# Patient Record
Sex: Female | Born: 1990 | Race: Black or African American | Hispanic: No | Marital: Single | Smoking: Never smoker
Health system: Southern US, Community
[De-identification: ages and names within clinical notes are randomized; demographics above are authoritative.]

## PROBLEM LIST (undated history)

## (undated) DIAGNOSIS — F32A Depression, unspecified: Secondary | ICD-10-CM

## (undated) DIAGNOSIS — F329 Major depressive disorder, single episode, unspecified: Secondary | ICD-10-CM

## (undated) DIAGNOSIS — A150 Tuberculosis of lung: Secondary | ICD-10-CM

## (undated) DIAGNOSIS — Z789 Other specified health status: Secondary | ICD-10-CM

## (undated) HISTORY — PX: NO PAST SURGERIES: SHX2092

---

## 2014-06-11 NOTE — L&D Delivery Note (Signed)
Patient is 24 y.o. G1P0 [redacted]w[redacted]d admitted for IOL for PROM.  Delivery Note At 1:02 PM a viable female was delivered via Vaginal, Spontaneous Delivery (Presentation: Left Occiput Anterior).  APGAR: 8, 9; weight pending.   Placenta status: Intact, Spontaneous. Cord: 3 vessels with the following complications: None.    Anesthesia: Epidural  Episiotomy: None Lacerations: 2nd degree Suture Repair: 3.0 vicryl Est. Blood Loss (mL):  75  Mom to postpartum.  Baby to Couplet care / Skin to Skin.  Tarri Abernethy 02/24/2015, 1:41 PM  OB fellow attestation: Patient is a G1P1001 at [redacted]w[redacted]d who was admitted for SOL, uncomplicated prenatal course.  She progressed without augmentation.   I was gloved and present for delivery in its entirety.  Second stage of labor progressed, baby delivered after 4-5 contractions.  Complications: none  Lacerations: 2nd degree  EBL:75  Federico Flake, MD 3:25 PM

## 2014-06-28 ENCOUNTER — Emergency Department (HOSPITAL_COMMUNITY)
Admission: EM | Admit: 2014-06-28 | Discharge: 2014-06-28 | Disposition: A | Discharge status: Home / Self Care | Payer: Managed Care, Other (non HMO) | Attending: Emergency Medicine | Admitting: Emergency Medicine

## 2014-06-28 ENCOUNTER — Emergency Department (HOSPITAL_COMMUNITY): Payer: Managed Care, Other (non HMO)

## 2014-06-28 ENCOUNTER — Encounter (HOSPITAL_COMMUNITY): Payer: Self-pay | Admitting: Family Medicine

## 2014-06-28 DIAGNOSIS — Z3A01 Less than 8 weeks gestation of pregnancy: Secondary | ICD-10-CM | POA: Insufficient documentation

## 2014-06-28 DIAGNOSIS — R1110 Vomiting, unspecified: Secondary | ICD-10-CM

## 2014-06-28 DIAGNOSIS — R1032 Left lower quadrant pain: Secondary | ICD-10-CM | POA: Insufficient documentation

## 2014-06-28 DIAGNOSIS — O210 Mild hyperemesis gravidarum: Secondary | ICD-10-CM | POA: Insufficient documentation

## 2014-06-28 DIAGNOSIS — Z3490 Encounter for supervision of normal pregnancy, unspecified, unspecified trimester: Secondary | ICD-10-CM

## 2014-06-28 DIAGNOSIS — O9989 Other specified diseases and conditions complicating pregnancy, childbirth and the puerperium: Secondary | ICD-10-CM | POA: Diagnosis present

## 2014-06-28 DIAGNOSIS — R1031 Right lower quadrant pain: Secondary | ICD-10-CM | POA: Diagnosis not present

## 2014-06-28 LAB — WET PREP, GENITAL
Trich, Wet Prep: NONE SEEN
WBC, Wet Prep HPF POC: NONE SEEN
Yeast Wet Prep HPF POC: NONE SEEN

## 2014-06-28 LAB — URINALYSIS, ROUTINE W REFLEX MICROSCOPIC
BILIRUBIN URINE: NEGATIVE
GLUCOSE, UA: NEGATIVE mg/dL
Hgb urine dipstick: NEGATIVE
Leukocytes, UA: NEGATIVE
Nitrite: NEGATIVE
Protein, ur: NEGATIVE mg/dL
Specific Gravity, Urine: 1.031 — ABNORMAL HIGH (ref 1.005–1.030)
Urobilinogen, UA: 1 mg/dL (ref 0.0–1.0)
pH: 6 (ref 5.0–8.0)

## 2014-06-28 LAB — CBC WITH DIFFERENTIAL/PLATELET
BASOS ABS: 0 10*3/uL (ref 0.0–0.1)
Basophils Relative: 0 % (ref 0–1)
Eosinophils Absolute: 0 10*3/uL (ref 0.0–0.7)
Eosinophils Relative: 0 % (ref 0–5)
HCT: 38.2 % (ref 36.0–46.0)
Hemoglobin: 12.9 g/dL (ref 12.0–15.0)
Lymphocytes Relative: 14 % (ref 12–46)
Lymphs Abs: 1.2 10*3/uL (ref 0.7–4.0)
MCH: 28.1 pg (ref 26.0–34.0)
MCHC: 33.8 g/dL (ref 30.0–36.0)
MCV: 83.2 fL (ref 78.0–100.0)
MONO ABS: 0.6 10*3/uL (ref 0.1–1.0)
MONOS PCT: 7 % (ref 3–12)
NEUTROS ABS: 6.5 10*3/uL (ref 1.7–7.7)
NEUTROS PCT: 79 % — AB (ref 43–77)
Platelets: 274 10*3/uL (ref 150–400)
RBC: 4.59 MIL/uL (ref 3.87–5.11)
RDW: 13.3 % (ref 11.5–15.5)
WBC: 8.3 10*3/uL (ref 4.0–10.5)

## 2014-06-28 LAB — COMPREHENSIVE METABOLIC PANEL
ALBUMIN: 4.6 g/dL (ref 3.5–5.2)
ALT: 12 U/L (ref 0–35)
AST: 23 U/L (ref 0–37)
Alkaline Phosphatase: 55 U/L (ref 39–117)
Anion gap: 14 (ref 5–15)
BUN: 7 mg/dL (ref 6–23)
CO2: 19 mmol/L (ref 19–32)
CREATININE: 0.75 mg/dL (ref 0.50–1.10)
Calcium: 9.5 mg/dL (ref 8.4–10.5)
Chloride: 104 mEq/L (ref 96–112)
GFR calc Af Amer: >90 mL/min (ref >90)
GFR calc non Af Amer: >90 mL/min (ref >90)
GLUCOSE: 86 mg/dL (ref 70–99)
Potassium: 3.8 mmol/L (ref 3.5–5.1)
Sodium: 137 mmol/L (ref 135–145)
Total Bilirubin: 1.4 mg/dL — ABNORMAL HIGH (ref 0.3–1.2)
Total Protein: 7.6 g/dL (ref 6.0–8.3)

## 2014-06-28 LAB — HCG, QUANTITATIVE, PREGNANCY: HCG, BETA CHAIN, QUANT, S: 47080 m[IU]/mL — AB (ref <5)

## 2014-06-28 LAB — PREGNANCY, URINE: Preg Test, Ur: POSITIVE — AB

## 2014-06-28 LAB — I-STAT BETA HCG BLOOD, ED (MC, WL, AP ONLY): I-stat hCG, quantitative: >2000 m[IU]/mL — ABNORMAL HIGH (ref <5)

## 2014-06-28 LAB — LIPASE, BLOOD: LIPASE: 25 U/L (ref 11–59)

## 2014-06-28 LAB — HCG, SERUM, QUALITATIVE: Preg, Serum: POSITIVE — AB

## 2014-06-28 MED ORDER — SODIUM CHLORIDE 0.9 % IV BOLUS (SEPSIS)
1000.0000 mL | Freq: Once | INTRAVENOUS | Status: AC
  Administered 2014-06-28: 1000 mL via INTRAVENOUS

## 2014-06-28 MED ORDER — PRENATAL COMPLETE 14-0.4 MG PO TABS: 1.0000 | ORAL_TABLET | Freq: Every day | ORAL | Status: DC

## 2014-06-28 NOTE — ED Notes (Signed)
PA at bedside.

## 2014-06-28 NOTE — Discharge Instructions (Signed)
Please call your doctor for a followup appointment within 24-48 hours. When you talk to your doctor please let them know that you were seen in the emergency department and have them acquire all of your records so that they can discuss the findings with you and formulate a treatment plan to fully care for your new and ongoing problems. Please follow-up with OB/GYN for further following regarding pregnancy Please rest and stay hydrated Please take to drink plenty of water Please take prenatal vitamins as prescribed Please continue to monitor symptoms closely and if symptoms are to worsen or change (fever greater than 101, chills, sweating, nausea, vomiting, chest pain, shortness of breathe, difficulty breathing, weakness, numbness, tingling, worsening or changes to pain pattern, abdominal cramping, vaginal bleeding, vaginal discharge, inability keep food or fluids down) please report back to the Emergency Department immediately.   First Trimester of Pregnancy The first trimester of pregnancy is from week 1 until the end of week 12 (months 1 through 3). A week after a sperm fertilizes an egg, the egg will implant on the wall of the uterus. This embryo will begin to develop into a baby. Genes from you and your partner are forming the baby. The female genes determine whether the baby is a boy or a girl. At 6-8 weeks, the eyes and face are formed, and the heartbeat can be seen on ultrasound. At the end of 12 weeks, all the baby's organs are formed.  Now that you are pregnant, you will want to do everything you can to have a healthy baby. Two of the most important things are to get good prenatal care and to follow your health care provider's instructions. Prenatal care is all the medical care you receive before the baby's birth. This care will help prevent, find, and treat any problems during the pregnancy and childbirth. BODY CHANGES Your body goes through many changes during pregnancy. The changes vary from  woman to woman.   You may gain or lose a couple of pounds at first.  You may feel sick to your stomach (nauseous) and throw up (vomit). If the vomiting is uncontrollable, call your health care provider.  You may tire easily.  You may develop headaches that can be relieved by medicines approved by your health care provider.  You may urinate more often. Painful urination may mean you have a bladder infection.  You may develop heartburn as a result of your pregnancy.  You may develop constipation because certain hormones are causing the muscles that push waste through your intestines to slow down.  You may develop hemorrhoids or swollen, bulging veins (varicose veins).  Your breasts may begin to grow larger and become tender. Your nipples may stick out more, and the tissue that surrounds them (areola) may become darker.  Your gums may bleed and may be sensitive to brushing and flossing.  Dark spots or blotches (chloasma, mask of pregnancy) may develop on your face. This will likely fade after the baby is born.  Your menstrual periods will stop.  You may have a loss of appetite.  You may develop cravings for certain kinds of food.  You may have changes in your emotions from day to day, such as being excited to be pregnant or being concerned that something may go wrong with the pregnancy and baby.  You may have more vivid and strange dreams.  You may have changes in your hair. These can include thickening of your hair, rapid growth, and changes in texture. Some  women also have hair loss during or after pregnancy, or hair that feels dry or thin. Your hair will most likely return to normal after your baby is born. WHAT TO EXPECT AT YOUR PRENATAL VISITS During a routine prenatal visit:  You will be weighed to make sure you and the baby are growing normally.  Your blood pressure will be taken.  Your abdomen will be measured to track your baby's growth.  The fetal heartbeat will be  listened to starting around week 10 or 12 of your pregnancy.  Test results from any previous visits will be discussed. Your health care provider may ask you:  How you are feeling.  If you are feeling the baby move.  If you have had any abnormal symptoms, such as leaking fluid, bleeding, severe headaches, or abdominal cramping.  If you have any questions. Other tests that may be performed during your first trimester include:  Blood tests to find your blood type and to check for the presence of any previous infections. They will also be used to check for low iron levels (anemia) and Rh antibodies. Later in the pregnancy, blood tests for diabetes will be done along with other tests if problems develop.  Urine tests to check for infections, diabetes, or protein in the urine.  An ultrasound to confirm the proper growth and development of the baby.  An amniocentesis to check for possible genetic problems.  Fetal screens for spina bifida and Down syndrome.  You may need other tests to make sure you and the baby are doing well. HOME CARE INSTRUCTIONS  Medicines  Follow your health care provider's instructions regarding medicine use. Specific medicines may be either safe or unsafe to take during pregnancy.  Take your prenatal vitamins as directed.  If you develop constipation, try taking a stool softener if your health care provider approves. Diet  Eat regular, well-balanced meals. Choose a variety of foods, such as meat or vegetable-based protein, fish, milk and low-fat dairy products, vegetables, fruits, and whole grain breads and cereals. Your health care provider will help you determine the amount of weight gain that is right for you.  Avoid raw meat and uncooked cheese. These carry germs that can cause birth defects in the baby.  Eating four or five small meals rather than three large meals a day may help relieve nausea and vomiting. If you start to feel nauseous, eating a few soda  crackers can be helpful. Drinking liquids between meals instead of during meals also seems to help nausea and vomiting.  If you develop constipation, eat more high-fiber foods, such as fresh vegetables or fruit and whole grains. Drink enough fluids to keep your urine clear or pale yellow. Activity and Exercise  Exercise only as directed by your health care provider. Exercising will help you:  Control your weight.  Stay in shape.  Be prepared for labor and delivery.  Experiencing pain or cramping in the lower abdomen or low back is a good sign that you should stop exercising. Check with your health care provider before continuing normal exercises.  Try to avoid standing for long periods of time. Move your legs often if you must stand in one place for a long time.  Avoid heavy lifting.  Wear low-heeled shoes, and practice good posture.  You may continue to have sex unless your health care provider directs you otherwise. Relief of Pain or Discomfort  Wear a good support bra for breast tenderness.   Take warm sitz baths to  soothe any pain or discomfort caused by hemorrhoids. Use hemorrhoid cream if your health care provider approves.   Rest with your legs elevated if you have leg cramps or low back pain.  If you develop varicose veins in your legs, wear support hose. Elevate your feet for 15 minutes, 3-4 times a day. Limit salt in your diet. Prenatal Care  Schedule your prenatal visits by the twelfth week of pregnancy. They are usually scheduled monthly at first, then more often in the last 2 months before delivery.  Write down your questions. Take them to your prenatal visits.  Keep all your prenatal visits as directed by your health care provider. Safety  Wear your seat belt at all times when driving.  Make a list of emergency phone numbers, including numbers for family, friends, the hospital, and police and fire departments. General Tips  Ask your health care provider  for a referral to a local prenatal education class. Begin classes no later than at the beginning of month 6 of your pregnancy.  Ask for help if you have counseling or nutritional needs during pregnancy. Your health care provider can offer advice or refer you to specialists for help with various needs.  Do not use hot tubs, steam rooms, or saunas.  Do not douche or use tampons or scented sanitary pads.  Do not cross your legs for long periods of time.  Avoid cat litter boxes and soil used by cats. These carry germs that can cause birth defects in the baby and possibly loss of the fetus by miscarriage or stillbirth.  Avoid all smoking, herbs, alcohol, and medicines not prescribed by your health care provider. Chemicals in these affect the formation and growth of the baby.  Schedule a dentist appointment. At home, brush your teeth with a soft toothbrush and be gentle when you floss. SEEK MEDICAL CARE IF:   You have dizziness.  You have mild pelvic cramps, pelvic pressure, or nagging pain in the abdominal area.  You have persistent nausea, vomiting, or diarrhea.  You have a bad smelling vaginal discharge.  You have pain with urination.  You notice increased swelling in your face, hands, legs, or ankles. SEEK IMMEDIATE MEDICAL CARE IF:   You have a fever.  You are leaking fluid from your vagina.  You have spotting or bleeding from your vagina.  You have severe abdominal cramping or pain.  You have rapid weight gain or loss.  You vomit blood or material that looks like coffee grounds.  You are exposed to [place] measles and have never had them.  You are exposed to fifth disease or chickenpox.  You develop a severe headache.  You have shortness of breath.  You have any kind of trauma, such as from a fall or a car accident. Document Released: 05/22/2001 Document Revised: 10/12/2013 Document Reviewed: 04/07/2013 [facility] Patient Information 2015 [place], [place]. This  information is not intended to replace advice given to you by your health care provider. Make sure you discuss any questions you have with your health care provider.   Emergency Department Resource Guide 1) Find a Doctor and Pay Out of Pocket Although you won't have to find out who is covered by your insurance plan, it is a good idea to ask around and get recommendations. You will then need to call the office and see if the doctor you have chosen will accept you as a new patient and what types of options they offer for patients who are self-pay. Some doctors  offer discounts or will set up payment plans for their patients who do not have insurance, but you will need to ask so you aren't surprised when you get to your appointment.  2) Contact Your Local Health Department Not all health departments have doctors that can see patients for sick visits, but many do, so it is worth a call to see if yours does. If you don't know where your local health department is, you can check in your phone book. The CDC also has a tool to help you locate your state's health department, and many state websites also have listings of all of their local health departments.  3) Find a Walk-in Clinic If your illness is not likely to be very severe or complicated, you may want to try a walk in clinic. These are popping up all over the country in pharmacies, drugstores, and shopping centers. They're usually staffed by nurse practitioners or physician assistants that have been trained to treat common illnesses and complaints. They're usually fairly quick and inexpensive. However, if you have serious medical issues or chronic medical problems, these are probably not your best option.  No Primary Care Doctor: - Call Health Connect at  [phone] - they can help you locate a primary care doctor that  accepts your insurance, provides certain services, etc. - Physician Referral Service- [phone]  Chronic Pain  Problems: Organization         Address  Phone   Notes  [facility]  [phone]) [phone] Patients need to be referred by their primary care doctor.   Medication Assistance: Organization         Address  Phone   Notes  [facility] Medication [facility] [address] [place], [place] [address] [phone] --Must be a resident of [place] -- Must have NO insurance coverage whatsoever (no Medicaid/ Medicare, etc.) -- The pt. MUST have a primary care doctor that directs their care regularly and follows them in the community   Med[facility]ist  [phone]   [employer]  [phone]) [phone]    Agencies that provide inexpensive medical care: Organization         Address  Phone   Notes  [facility]  [phone]   [facility]    [phone]   [facility] [address] [place], [place] [address] [phone]) [phone]   [facility] [address], [place] [phone]   [facility]    [phone]) [phone]   [facility]    [phone](336) [phone]   [facility]  [address], [place] Phone:  [phone], Fax:  [phone] Hours of Operation:  9 am - 6 pm, M-F.  Also accepts Medicaid/Medicare and self-pay.  [facility]  [address], [place] Phone: [phone], Fax: [phone]. Hours of Operation:  8:30 am - 5:30 pm, M-F.  Also accepts Medicaid and self-pay.  [facility] [place] [address], [place] Phone: [phone]) [phone]   [facility] [address], [place], [place] [phone], Ext. [phone] Mondays & Thursdays: 7-9 AM.  First 15 patients are seen on a first come, first serve basis.    Medicaid-accepting [facility] Providers:  Organization         Address  Phone   Notes  [facility] [address],  [place] [phone] Also  accepts self-pay patients.  [facility] [address] Laurell Josephs [place], [place]  [phone]   [facility] [address], [place] [phone]   [facility] [address], [place] [phone]   Renaye Rakers [address], [place]   [phone] Only accepts [place] Access [place] patients after they have their name applied to their card.   Self-Pay (no insurance) in [facility]:  Organization         Address  Phone   Notes  Sickle Cell Patients, [facility] [address] [place], [place] [phone]   [facility] [address] [place], [place] [phone]   [facility] [place]  [address], [place] [phone]   [facility]/Dr. Osei-Bonsu  [address], [place] or [address], [place] [phone] Phone number for both [place] and [place] locations is the same.  [facility] and [facility] [address] [place] [phone]   [facility] [address], [place] or [address] [phone]   [facility] [address] [place] [phone], phone; [phone], fax Sees patients 1st and 3rd Saturday of every month.  Must not qualify for public or private insurance (i.e. Medicaid, Medicare, [place] Health Choice, Veterans' Benefits)  Household income should be no more than 200% of the poverty level The clinic cannot treat you if you are pregnant or think you are pregnant  Sexually transmitted diseases are not treated at the clinic.    Dental Care: Organization         Address  Phone  Notes  [facility] [address] [place], [place] [phone] Accepts children up to age 57 who are enrolled in [place] or [place] Health Choice; pregnant  women with a Medicaid card; and children who have applied for Medicaid or [place] Health Choice, but were declined, whose parents can pay a reduced fee at time of service.  [facility] Department of [facility]  [address], [place] [phone] Accepts children up to age 34 who are enrolled in [place] or [place] Health Choice; pregnant women with a Medicaid card; and children who have applied for Medicaid or [place] Health Choice, but were declined, whose parents can pay a reduced fee at time of service.  [facility]  [address] [place], [place] [phone] Patients are seen by appointment only. Walk-ins are not accepted. [facility] will see patients 4 years of age and older. Monday - Tuesday (8am-5pm) Most Wednesdays (8:30-5pm) $30 per visit, cash only  [facility]  [address], [facility] [phone] Patients are seen by appointment only. Walk-ins are not accepted. [facility] will see patients 91 years of age and older. One Wednesday Evening (Monthly: Volunteer Based).  $30 per visit, cash only  [employer]  [phone] for adults; Children under age 85, call Graduate Pediatric Dentistry at [phone]. Children aged 43-14, please call [phone] to request a pediatric application.  Dental services are provided in all areas of dental care including fillings, crowns and bridges, complete and partial dentures, implants, gum treatment, root canals, and extractions. Preventive care is  also provided. Treatment is provided to both adults and children. Patients are selected via a lottery and there is often a waiting list.   [facility] [address], [place]  [phone] [email or web address]   [facility] [address] [place], [place] [phone], Ext. [phone] Second and Fourth Thursday of each month, opens at 6:30 AM; Clinic ends at 9 AM.  Patients are  seen on a first-come first-served basis, and a limited number are seen during each clinic.   [facility]  [address] Ether Griffins [place], [place] [phone]   Eligibility Requirements You must have lived in [place], [place], or [place] counties for at least the last three months.   You cannot be eligible for state or federal sponsored [place], including [employer], [place], or [employer].   You generally cannot be eligible for healthcare insurance through your employer.    How to apply: Eligibility screenings are held every Tuesday and Wednesday afternoon from 1:00 pm until 4:00 pm. You do not need an appointment for the interview!  [facility] [address], [place], [place] [phone]   [facility] Health Department  [phone]   [facility] Health Department  [phone]   [facility] Health Department  [phone]    Behavioral Health Resources in the Community: Intensive Outpatient Programs Organization         Address  Phone  Notes  [facility] [address], [place], [place] [phone]   [facility] Outpatient [address], [place], [place] [phone]   [facility] [address], [place], [place]  [phone]   [facility] Mental Health [address]  [place], [place] [phone] or [phone]   Substance Abuse Resources Organization         Address  Phone  Notes  [facility] Services  [phone]   [facility]  [phone]   [facility] [place]  [phone]   Floydene Flock  [phone]   Residential & Outpatient Substance Abuse Program  [phone]   Psychological Services Organization         Address  Phone  Notes  [facility] Behavioral Health  [phone]   [facility] Services  [phone]   [facility] Mental Health [address], [place] [phone] or [phone]    Mobile Crisis  Teams Organization         Address  Phone  Notes  [facility], Mobile Crisis Care Unit  [phone]   [facility]  [address] [place], [place] [phone]   Doristine Locks [address] [place] [phone]    Self-Help/Support Groups Organization         Address  Phone             Notes  [facility] [place] - variety of support groups  [phone]- I7437963 Call for more information  Narcotics Anonymous (NA), [facility] [address], [employer] [place]  2 meetings at this location   Statistician         Address  Phone  Notes  [facility] [address],    [place]  [phone]   [facility]  [address] [place] [address], [place], [place] [phone]   [facility] [address] [place], [place] [phone] Admissions: 8am-3pm M-F  [facility] [address]  [place], [place] [phone]   [facility] [address] Starling Manns [place], [place] [phone]   [facility] [address],  [place], [place] [phone]   [facility] - Intensive Outpatient [address], [place], [place] [phone]   [facility] (Addiction Recovery Care [facility] [address] [place].,  [place], [place] [phone] or [phone]   [facility] ([facility]) [address], [place], [place] [phone] Accepts Medicaid  [facility] [address]  [place] [phone] Substance Abuse/Addiction Treatment   [facility] Organization         Address  Phone  Notes  [facility]  [phone]   Angie Fava, PhD [address] Ervin Knack [place], [place]   [phone] or [phone]   [facility] Behavioral   [address] [place], [place] [phone]   [facility] [address], [place], [place] [phone]  Insurance/Medicaid/sponsorship through [facility] and Families [address]                                    [place], [place] [phone] Therapy/tele-psych/case  [facility] [address], [place] [phone]    Dr. Lolly Mustache  [phone]   [facility]  [facility] Dept. 1) [address], [place] 2) [address] [place] 3)  [address], [place] [phone]  [phone]   [facility] Child Abuse Hotline (416) 772-2030 or [phone] (After Hours)

## 2014-06-28 NOTE — ED Notes (Signed)
Pt a/o x 4 on d/c with steady gait. 

## 2014-06-28 NOTE — ED Notes (Signed)
Gave pt gingerale and crackers to see if she can tolerate.

## 2014-06-28 NOTE — ED Notes (Signed)
Pt here for abd pain, N,V and positive preg test. sts she started birth control at the beginning of the month and LMP December 7th.

## 2014-06-28 NOTE — ED Notes (Signed)
Pt able to tolerate fluids 

## 2014-06-28 NOTE — ED Provider Notes (Signed)
CSN: [id number]     Arrival date & time 06/28/14  1340 History   First MD Initiated Contact with Patient 06/28/14 1700     Chief Complaint  Patient presents with  . Abdominal Pain     (Consider location/radiation/quality/duration/timing/severity/associated sxs/prior Treatment) The history is provided by the patient. No language interpreter was used.  Deanna Warren is a 24 y/o F with no known significant PMHx presenting to the ED with abdominal pain, nausea, vomiting that started on Thursday. Patient reported that she has been having lower abdominal pain, mostly to the LLQ described as a constant aching sensation with intermittent sharp pain. Patient reported that she has been having episodes of emesis - stated that she has been able to keep fluids down, but denied food. Stated that she ate [employer] and [employer] on Thursday - denied any symptoms afterwards. Reported that her LMP was either the 05/17/2014 or 05/21/2014 - cannot remember. Stated that she got a DepoProvera shot early in January 2016 - cannot remember the date. Stated that she took a urine pregnancy test a couple of days ago and reported that it was positive. Denied diarrhea, fever, melena, hematochezia, chest pain, shortness of breath, difficulty breathing, hematuria, dysuria, vaginal bleeding, vaginal discharge, vaginal pain, abdominal cramping, dizziness, syncope, visual changes, back pain, neck pain, neck stiffness. PCP none  History reviewed. No pertinent past medical history. History reviewed. No pertinent past surgical history. History reviewed. No pertinent family history. History  Substance Use Topics  . Smoking status: Never Smoker   . Smokeless tobacco: Not on file  . Alcohol Use: Not on file   OB History    No data available     Review of Systems  Constitutional: Negative for fever and chills.  Respiratory: Negative for chest tightness and shortness of breath.   Cardiovascular: Negative for chest pain.   Gastrointestinal: Positive for nausea, vomiting and abdominal pain. Negative for diarrhea, constipation, blood in stool and anal bleeding.  Genitourinary: Negative for dysuria, decreased urine volume, vaginal bleeding, vaginal discharge, vaginal pain and pelvic pain.  Musculoskeletal: Negative for back pain, neck pain and neck stiffness.  Neurological: Negative for dizziness, weakness, numbness and headaches.      Allergies  Review of patient's allergies indicates no known allergies.  Home Medications   Prior to Admission medications   Medication Sig Start Date End Date Taking? Authorizing Provider  Prenatal Vit-Fe Fumarate-FA (PRENATAL COMPLETE) 14-0.4 MG TABS Take 1 tablet by mouth daily. 06/28/14   Paulett Kaufhold, PA-C   BP 106/45 mmHg  Pulse 79  Temp(Src) 97.8 F (36.6 C) (Oral)  Resp 16  SpO2 100%  LMP 05/17/2014 Physical Exam  Constitutional: She is oriented to person, place, and time. She appears well-developed and well-nourished. No distress.  HENT:  Head: Normocephalic and atraumatic.  Eyes: Conjunctivae and EOM are normal. Pupils are equal, round, and reactive to light. Right eye exhibits no discharge. Left eye exhibits no discharge.  Neck: Normal range of motion. Neck supple. No tracheal deviation present.  Cardiovascular: Normal rate, regular rhythm and normal heart sounds.  Exam reveals no friction rub.   No murmur heard. Pulmonary/Chest: Effort normal and breath sounds normal. No respiratory distress. She has no wheezes. She has no rales.  Abdominal: Soft. Bowel sounds are normal. She exhibits no distension. There is tenderness in the right lower quadrant, suprapubic area and left lower quadrant. There is no rebound and no guarding.  Negative abdominal distention Bowel sounds normal active in all 4 quadrants Abdomen  soft upon palpation Mild discomfort upon palpation to the lower quadrants of the abdomen-left lower quadrant, right lower quadrant, suprapubic-most  discomfort upon palpation to left lower quadrant Negative peritoneal signs Negative guarding or rigidity noted  Genitourinary:  Pelvic exam: Negative swelling, erythema, inflammation, lesions, sores, deformities, area of fluctuance or induration identified-negative findings of abscesses to the external region. Negative masses, polyps or lesions identified to the vaginal canal. Negative bright red blood in the vaginal vault. Thick white discharge in moderate amount identified. Cervix identified with negative friability. Negative CMT, adnexal tenderness bilaterally. Exam chaperoned with tech, June Leap.   Musculoskeletal: Normal range of motion.  Full ROM to upper and lower extremities without difficulty noted, negative ataxia noted.  Lymphadenopathy:    She has no cervical adenopathy.  Neurological: She is alert and oriented to person, place, and time. No cranial nerve deficit. She exhibits normal muscle tone. Coordination normal.  Skin: Skin is warm and dry. No rash noted. She is not diaphoretic. No erythema.  Psychiatric: She has a normal mood and affect. Her behavior is normal. Thought content normal.  Nursing note and vitals reviewed.   ED Course  Procedures (including critical care time)  Results for orders placed or performed during the hospital encounter of 06/28/14  Wet prep, genital  Result Value Ref Range   Yeast Wet Prep HPF POC NONE SEEN NONE SEEN   Trich, Wet Prep NONE SEEN NONE SEEN   Clue Cells Wet Prep HPF POC FEW (A) NONE SEEN   WBC, Wet Prep HPF POC NONE SEEN NONE SEEN  CBC with Differential  Result Value Ref Range   WBC 8.3 4.0 - 10.5 K/uL   RBC 4.59 3.87 - 5.11 MIL/uL   Hemoglobin 12.9 12.0 - 15.0 g/dL   HCT 76.1 60.7 - 37.1 %   MCV 83.2 78.0 - 100.0 fL   MCH 28.1 26.0 - 34.0 pg   MCHC 33.8 30.0 - 36.0 g/dL   RDW 06.2 69.4 - 85.4 %   Platelets 274 150 - 400 K/uL   Neutrophils Relative % 79 (H) 43 - 77 %   Neutro Abs 6.5 1.7 - 7.7 K/uL   Lymphocytes Relative 14  12 - 46 %   Lymphs Abs 1.2 0.7 - 4.0 K/uL   Monocytes Relative 7 3 - 12 %   Monocytes Absolute 0.6 0.1 - 1.0 K/uL   Eosinophils Relative 0 0 - 5 %   Eosinophils Absolute 0.0 0.0 - 0.7 K/uL   Basophils Relative 0 0 - 1 %   Basophils Absolute 0.0 0.0 - 0.1 K/uL  Comprehensive metabolic panel  Result Value Ref Range   Sodium 137 135 - 145 mmol/L   Potassium 3.8 3.5 - 5.1 mmol/L   Chloride 104 96 - 112 mEq/L   CO2 19 19 - 32 mmol/L   Glucose, Bld 86 70 - 99 mg/dL   BUN 7 6 - 23 mg/dL   Creatinine, Ser 6.27 0.50 - 1.10 mg/dL   Calcium 9.5 8.4 - 03.5 mg/dL   Total Protein 7.6 6.0 - 8.3 g/dL   Albumin 4.6 3.5 - 5.2 g/dL   AST 23 0 - 37 U/L   ALT 12 0 - 35 U/L   Alkaline Phosphatase 55 39 - 117 U/L   Total Bilirubin 1.4 (H) 0.3 - 1.2 mg/dL   GFR calc non Af Amer >90 >90 mL/min   GFR calc Af Amer >90 >90 mL/min   Anion gap 14 5 - 15  Pregnancy, urine  Result Value Ref Range   Preg Test, Ur POSITIVE (A) NEGATIVE  Urinalysis, Routine w reflex microscopic  Result Value Ref Range   Color, Urine YELLOW YELLOW   APPearance CLOUDY (A) CLEAR   Specific Gravity, Urine 1.031 (H) 1.005 - 1.030   pH 6.0 5.0 - 8.0   Glucose, UA NEGATIVE NEGATIVE mg/dL   Hgb urine dipstick NEGATIVE NEGATIVE   Bilirubin Urine NEGATIVE NEGATIVE   Ketones, ur >80 (A) NEGATIVE mg/dL   Protein, ur NEGATIVE NEGATIVE mg/dL   Urobilinogen, UA 1.0 0.0 - 1.0 mg/dL   Nitrite NEGATIVE NEGATIVE   Leukocytes, UA NEGATIVE NEGATIVE  Lipase, blood  Result Value Ref Range   Lipase 25 11 - 59 U/L  hCG, serum, qualitative  Result Value Ref Range   Preg, Serum POSITIVE (A) NEGATIVE  I-Stat Beta hCG blood, ED ([facility], [facility], [facility] only)  Result Value Ref Range   I-stat hCG, quantitative >2000.0 (H) <5 mIU/mL   Comment 3            Labs Review Labs Reviewed  WET PREP, GENITAL - Abnormal; Notable for the following:    Clue Cells Wet Prep HPF POC FEW (*)    All other components within normal limits  CBC WITH DIFFERENTIAL -  Abnormal; Notable for the following:    Neutrophils Relative % 79 (*)    All other components within normal limits  COMPREHENSIVE METABOLIC PANEL - Abnormal; Notable for the following:    Total Bilirubin 1.4 (*)    All other components within normal limits  PREGNANCY, URINE - Abnormal; Notable for the following:    Preg Test, Ur POSITIVE (*)    All other components within normal limits  URINALYSIS, ROUTINE W REFLEX MICROSCOPIC - Abnormal; Notable for the following:    APPearance CLOUDY (*)    Specific Gravity, Urine 1.031 (*)    Ketones, ur >80 (*)    All other components within normal limits  HCG, SERUM, QUALITATIVE - Abnormal; Notable for the following:    Preg, Serum POSITIVE (*)    All other components within normal limits  I-STAT BETA HCG BLOOD, ED ([facility], [facility], [facility] ONLY) - Abnormal; Notable for the following:    I-stat hCG, quantitative >2000.0 (*)    All other components within normal limits  LIPASE, BLOOD  HCG, QUANTITATIVE, PREGNANCY  GC/CHLAMYDIA PROBE AMP ([place])    Imaging Review US Ob Comp Less 14 Wks  06/28/2014   CLINICAL DATA:  Pregnant patient with abdominal pain, nausea and vomiting. Patient is [redacted] weeks pregnant based on the last menstrual period.  EXAM: OBSTETRIC <14 WK [place] AND TRANSVAGINAL OB US  TECHNIQUE: Both transabdominal and transvaginal ultrasound examinations were performed for complete evaluation of the gestation as well as the maternal uterus, adnexal regions, and pelvic cul-de-sac. Transvaginal technique was performed to assess early pregnancy.  COMPARISON:  None.  FINDINGS: Intrauterine gestational sac: Visualized/normal in shape.  Yolk sac:  Yes  Embryo:  Yes  Cardiac Activity: Yes  Heart Rate:  119 bpm  CRL:   4.7  mm   6 w 1 d                  [place] EDC: 02/20/2015  Maternal uterus/adnexae: No uterine mass. No subchorionic hemorrhage or endometrial fluid. Cervix unremarkable.  Ovaries unremarkable. Right ovary corpus luteum noted. No adnexal masses. No pelvic  free fluid.  IMPRESSION: 1. Single live intrauterine pregnancy with a measured gestational age of [redacted] weeks and 1 day,  concordant with the estimated gestational age based on the last menstrual period. 2. No pregnancy complication.  Normal ovaries and adnexa.   Electronically Signed   By: Amie Portlandavid  Ormond M.D.   On: 06/28/2014 18:58   [place] Ob Transvaginal  06/28/2014   CLINICAL DATA:  Pregnant patient with abdominal pain, nausea and vomiting. Patient is [redacted] weeks pregnant based on the last menstrual period.  EXAM: OBSTETRIC <14 WK US AND TRANSVAGINAL OB US  TECHNIQUE: Both transabdominal and transvaginal ultrasound examinations were performed for complete evaluation of the gestation as well as the maternal uterus, adnexal regions, and pelvic cul-de-sac. Transvaginal technique was performed to assess early pregnancy.  COMPARISON:  None.  FINDINGS: Intrauterine gestational sac: Visualized/normal in shape.  Yolk sac:  Yes  Embryo:  Yes  Cardiac Activity: Yes  Heart Rate:  119 bpm  CRL:   4.7  mm   6 w 1 d                  [place] EDC: 02/20/2015  Maternal uterus/adnexae: No uterine mass. No subchorionic hemorrhage or endometrial fluid. Cervix unremarkable.  Ovaries unremarkable. Right ovary corpus luteum noted. No adnexal masses. No pelvic free fluid.  IMPRESSION: 1. Single live intrauterine pregnancy with a measured gestational age of [redacted] weeks and 1 day, concordant with the estimated gestational age based on the last menstrual period. 2. No pregnancy complication.  Normal ovaries and adnexa.   Electronically Signed   By: Amie Portlandavid  Ormond M.D.   On: 06/28/2014 18:58      EKG Interpretation None       7:56 PM This provider spoke with Dr. Gust RungStenson, on-call physician for OBGYN. Discussed case regarding early pregnancy measuring approximately 6 weeks on ultrasound with patient recently receiving Depo-Provera shot approximately week and half ago. Discussed with physician if there is concern for possible affects of the Depo-Provera  in early pregnancy. As per physician, reported that there should not be. Recommended patient to be followed up with OB/GYN.  MDM   Final diagnoses:  Pregnant  Intractable vomiting with nausea, vomiting of unspecified type    Medications  sodium chloride 0.9 % bolus 1,000 mL (0 mLs Intravenous Stopped 06/28/14 1926)    Filed Vitals:   06/28/14 1900 06/28/14 1915 06/28/14 1930 06/28/14 1945  BP: 94/78 110/60 106/45   Pulse:  95  79  Temp:      TempSrc:      Resp:  16    SpO2:  100%  100%    CBC negative elevated leukocytosis. Hemoglobin 12.9, hematocrit 30.2. CMP unremarkable-mildly elevated bilirubin of 1.4. I-STAT beta hCG elevated at greater than 2000. Urinalysis unremarkable-negative findings of nitrites or leukocytes-negative findings of infection. Wet prep unremarkable-negative findings of trichomoniasis, yeast, white blood cells. Pelvic ultrasound identified single live intrauterine pregnancy with a measured gestational age of [redacted] weeks and 1 day with cardiac activity at 119 bpm. No pregnancy comp location noted at this time. Normal ovaries and adnexa. Negative findings of ectopic pregnancy. Negative findings of infection. Doubt UTI or pyelonephritis. No pregnancy comp occasions noted at this time. Patient recently just diagnosed with being pregnant, approximate gestational age of [redacted] weeks 1 day based on ultrasound-IUP identified. Suspicion of nausea and vomiting secondary to early pregnancy. This provider had a long discussion with on-call OB/GYN physician regarding concern for possible affects of Depo-Provera on early stages of pregnancy-reported that there should not be any effects, recommended close follow-up. Patient stable, afebrile. Patient not septic appearing. Abdominal exam  benign-doubt acute infectious processes or acute abdomen. Patient tolerated fluids without difficulty-negative episodes of emesis while in ED setting. Discharged patient. Discharged patient with prenatal  vitamins. [facility] in [facility] clinic. Discussed with patient to rest and stay hydrated. Discussed with patient to closely monitor symptoms and if symptoms are to worsen or change to report back to the ED - strict return instructions given.  Patient agreed to plan of care, understood, all questions answered.  Raymon Mutton, PA-C 06/28/14 2023  Raymon Mutton, PA-C 06/28/14 1610  Arby Barrette, MD 06/29/14 2229

## 2014-06-29 LAB — GC/CHLAMYDIA PROBE AMP (~~LOC~~) NOT AT ARMC
Chlamydia: NEGATIVE
Neisseria Gonorrhea: NEGATIVE

## 2014-07-09 ENCOUNTER — Emergency Department (HOSPITAL_COMMUNITY)
Admission: EM | Admit: 2014-07-09 | Discharge: 2014-07-09 | Disposition: A | Discharge status: Home / Self Care | Payer: Managed Care, Other (non HMO) | Attending: Emergency Medicine | Admitting: Emergency Medicine

## 2014-07-09 ENCOUNTER — Emergency Department (HOSPITAL_COMMUNITY): Payer: Managed Care, Other (non HMO)

## 2014-07-09 ENCOUNTER — Encounter (HOSPITAL_COMMUNITY): Payer: Self-pay

## 2014-07-09 DIAGNOSIS — Z3A01 Less than 8 weeks gestation of pregnancy: Secondary | ICD-10-CM | POA: Insufficient documentation

## 2014-07-09 DIAGNOSIS — Z79899 Other long term (current) drug therapy: Secondary | ICD-10-CM | POA: Insufficient documentation

## 2014-07-09 DIAGNOSIS — B9689 Other specified bacterial agents as the cause of diseases classified elsewhere: Secondary | ICD-10-CM

## 2014-07-09 DIAGNOSIS — O9989 Other specified diseases and conditions complicating pregnancy, childbirth and the puerperium: Secondary | ICD-10-CM | POA: Diagnosis not present

## 2014-07-09 DIAGNOSIS — R0682 Tachypnea, not elsewhere classified: Secondary | ICD-10-CM | POA: Diagnosis not present

## 2014-07-09 DIAGNOSIS — R52 Pain, unspecified: Secondary | ICD-10-CM

## 2014-07-09 DIAGNOSIS — R61 Generalized hyperhidrosis: Secondary | ICD-10-CM | POA: Diagnosis not present

## 2014-07-09 DIAGNOSIS — Z3490 Encounter for supervision of normal pregnancy, unspecified, unspecified trimester: Secondary | ICD-10-CM

## 2014-07-09 DIAGNOSIS — N760 Acute vaginitis: Secondary | ICD-10-CM

## 2014-07-09 DIAGNOSIS — R1032 Left lower quadrant pain: Secondary | ICD-10-CM

## 2014-07-09 DIAGNOSIS — O23591 Infection of other part of genital tract in pregnancy, first trimester: Secondary | ICD-10-CM | POA: Insufficient documentation

## 2014-07-09 LAB — URINALYSIS, ROUTINE W REFLEX MICROSCOPIC
Bilirubin Urine: NEGATIVE
GLUCOSE, UA: NEGATIVE mg/dL
HGB URINE DIPSTICK: NEGATIVE
Ketones, ur: NEGATIVE mg/dL
NITRITE: NEGATIVE
Protein, ur: NEGATIVE mg/dL
Specific Gravity, Urine: 1.013 (ref 1.005–1.030)
Urobilinogen, UA: 0.2 mg/dL (ref 0.0–1.0)
pH: 7.5 (ref 5.0–8.0)

## 2014-07-09 LAB — URINE MICROSCOPIC-ADD ON

## 2014-07-09 LAB — CBC WITH DIFFERENTIAL/PLATELET
Basophils Absolute: 0 10*3/uL (ref 0.0–0.1)
Basophils Relative: 0 % (ref 0–1)
Eosinophils Absolute: 0.1 10*3/uL (ref 0.0–0.7)
Eosinophils Relative: 1 % (ref 0–5)
HCT: 35.7 % — ABNORMAL LOW (ref 36.0–46.0)
Hemoglobin: 12.2 g/dL (ref 12.0–15.0)
LYMPHS ABS: 2.3 10*3/uL (ref 0.7–4.0)
LYMPHS PCT: 32 % (ref 12–46)
MCH: 27.9 pg (ref 26.0–34.0)
MCHC: 34.2 g/dL (ref 30.0–36.0)
MCV: 81.5 fL (ref 78.0–100.0)
Monocytes Absolute: 0.7 10*3/uL (ref 0.1–1.0)
Monocytes Relative: 10 % (ref 3–12)
Neutro Abs: 4.2 10*3/uL (ref 1.7–7.7)
Neutrophils Relative %: 57 % (ref 43–77)
Platelets: 229 10*3/uL (ref 150–400)
RBC: 4.38 MIL/uL (ref 3.87–5.11)
RDW: 12.8 % (ref 11.5–15.5)
WBC: 7.3 10*3/uL (ref 4.0–10.5)

## 2014-07-09 LAB — BASIC METABOLIC PANEL
ANION GAP: 8 (ref 5–15)
BUN: 9 mg/dL (ref 6–23)
CO2: 21 mmol/L (ref 19–32)
Calcium: 9 mg/dL (ref 8.4–10.5)
Chloride: 104 mmol/L (ref 96–112)
Creatinine, Ser: 0.63 mg/dL (ref 0.50–1.10)
GFR calc Af Amer: >90 mL/min (ref >90)
GFR calc non Af Amer: >90 mL/min (ref >90)
Glucose, Bld: 82 mg/dL (ref 70–99)
POTASSIUM: 3.7 mmol/L (ref 3.5–5.1)
Sodium: 133 mmol/L — ABNORMAL LOW (ref 135–145)

## 2014-07-09 LAB — TYPE AND SCREEN
ABO/RH(D): O POS
Antibody Screen: NEGATIVE

## 2014-07-09 LAB — WET PREP, GENITAL
Trich, Wet Prep: NONE SEEN
Yeast Wet Prep HPF POC: NONE SEEN

## 2014-07-09 LAB — ABO/RH: ABO/RH(D): O POS

## 2014-07-09 LAB — HCG, QUANTITATIVE, PREGNANCY: HCG, BETA CHAIN, QUANT, S: 155154 m[IU]/mL — AB (ref <5)

## 2014-07-09 MED ORDER — MORPHINE SULFATE 4 MG/ML IJ SOLN
4.0000 mg | Freq: Once | INTRAMUSCULAR | Status: AC
Start: 2014-07-09 — End: 2014-07-09
  Administered 2014-07-09: 4 mg via INTRAVENOUS
  Filled 2014-07-09: qty 1

## 2014-07-09 MED ORDER — ONDANSETRON HCL 4 MG/2ML IJ SOLN
4.0000 mg | Freq: Once | INTRAMUSCULAR | Status: AC
  Administered 2014-07-09: 4 mg via INTRAVENOUS
  Filled 2014-07-09: qty 2

## 2014-07-09 MED ORDER — SODIUM CHLORIDE 0.9 % IV BOLUS (SEPSIS)
1000.0000 mL | Freq: Once | INTRAVENOUS | Status: AC
  Administered 2014-07-09: 1000 mL via INTRAVENOUS

## 2014-07-09 MED ORDER — METRONIDAZOLE 500 MG PO TABS: 500.0000 mg | ORAL_TABLET | Freq: Two times a day (BID) | ORAL | Status: DC

## 2014-07-09 NOTE — ED Notes (Signed)
Pt presents with sudden onset of LLQ abdominal pain while at work today.  Pt is [redacted] weeks pregnant, denies any vaginal discharge or bleeding; 1st pregnancy

## 2014-07-09 NOTE — ED Notes (Signed)
This RN walked into my new room to introduce self and was bombarded by Pt friend asking questions in an irritated mood and stated that she had waited too long and wanted answers.  RN told pt and friend that I needed a second to look at the chart to see what pt needs were and pt friend continued to be abrasive and wanted immediate answers from RN or supervisor.  RN asked pt friend to calm down and give me a chance to look over the chart.  She lingered near RNs back still fussing so RN excused self to report pt friend's angry mood to Charge.  RN allowed time for friend to calm down and then went back into the room to discuss pt needs with pt and requested pt friend to allow me to communicate with the pt when she started  Talking over RN again.  Pt friend voluntarily went to waiting room to wait on Pt there.

## 2014-07-09 NOTE — Discharge Instructions (Signed)
You may take Tylenol 1000 mg every 6 hours as needed for pain.   Bacterial Vaginosis Bacterial vaginosis is a vaginal infection that occurs when the normal balance of bacteria in the vagina is disrupted. It results from an overgrowth of certain bacteria. This is the most common vaginal infection in women of childbearing age. Treatment is important to prevent complications, especially in pregnant women, as it can cause a premature delivery. CAUSES  Bacterial vaginosis is caused by an increase in harmful bacteria that are normally present in smaller amounts in the vagina. Several different kinds of bacteria can cause bacterial vaginosis. However, the reason that the condition develops is not fully understood. RISK FACTORS Certain activities or behaviors can put you at an increased risk of developing bacterial vaginosis, including:  Having a new sex partner or multiple sex partners.  Douching.  Using an intrauterine device (IUD) for contraception. Women do not get bacterial vaginosis from toilet seats, bedding, swimming pools, or contact with objects around them. SIGNS AND SYMPTOMS  Some women with bacterial vaginosis have no signs or symptoms. Common symptoms include:  Grey vaginal discharge.  A fishlike odor with discharge, especially after sexual intercourse.  Itching or burning of the vagina and vulva.  Burning or pain with urination. DIAGNOSIS  Your health care provider will take a medical history and examine the vagina for signs of bacterial vaginosis. A sample of vaginal fluid may be taken. Your health care provider will look at this sample under a microscope to check for bacteria and abnormal cells. A vaginal pH test may also be done.  TREATMENT  Bacterial vaginosis may be treated with antibiotic medicines. These may be given in the form of a pill or a vaginal cream. A second round of antibiotics may be prescribed if the condition comes back after treatment.  HOME CARE INSTRUCTIONS    Only take over-the-counter or prescription medicines as directed by your health care provider.  If antibiotic medicine was prescribed, take it as directed. Make sure you finish it even if you start to feel better.  Do not have sex until treatment is completed.  Tell all sexual partners that you have a vaginal infection. They should see their health care provider and be treated if they have problems, such as a mild rash or itching.  Practice safe sex by using condoms and only having one sex partner. SEEK MEDICAL CARE IF:   Your symptoms are not improving after 3 days of treatment.  You have increased discharge or pain.  You have a fever. MAKE SURE YOU:   Understand these instructions.  Will watch your condition.  Will get help right away if you are not doing well or get worse. FOR MORE INFORMATION  Centers for Disease Control and Prevention, Division of STD Prevention: [employer] American Sexual Health Association (ASHA): www.ashastd.org  Document Released: 05/28/2005 Document Revised: 03/18/2013 Document Reviewed: 01/07/2013 [facility] Patient Information 2015 [place], [place]. This information is not intended to replace advice given to you by your health care provider. Make sure you discuss any questions you have with your health care provider.    First Trimester of Pregnancy The first trimester of pregnancy is from week 1 until the end of week 12 (months 1 through 3). A week after a sperm fertilizes an egg, the egg will implant on the wall of the uterus. This embryo will begin to develop into a baby. Genes from you and your partner are forming the baby. The female genes determine whether the baby  is a boy or a girl. At 6-8 weeks, the eyes and face are formed, and the heartbeat can be seen on ultrasound. At the end of 12 weeks, all the baby's organs are formed.  Now that you are pregnant, you will want to do everything you can to have a healthy baby. Two of the most important  things are to get good prenatal care and to follow your health care provider's instructions. Prenatal care is all the medical care you receive before the baby's birth. This care will help prevent, find, and treat any problems during the pregnancy and childbirth. BODY CHANGES Your body goes through many changes during pregnancy. The changes vary from woman to woman.   You may gain or lose a couple of pounds at first.  You may feel sick to your stomach (nauseous) and throw up (vomit). If the vomiting is uncontrollable, call your health care provider.  You may tire easily.  You may develop headaches that can be relieved by medicines approved by your health care provider.  You may urinate more often. Painful urination may mean you have a bladder infection.  You may develop heartburn as a result of your pregnancy.  You may develop constipation because certain hormones are causing the muscles that push waste through your intestines to slow down.  You may develop hemorrhoids or swollen, bulging veins (varicose veins).  Your breasts may begin to grow larger and become tender. Your nipples may stick out more, and the tissue that surrounds them (areola) may become darker.  Your gums may bleed and may be sensitive to brushing and flossing.  Dark spots or blotches (chloasma, mask of pregnancy) may develop on your face. This will likely fade after the baby is born.  Your menstrual periods will stop.  You may have a loss of appetite.  You may develop cravings for certain kinds of food.  You may have changes in your emotions from day to day, such as being excited to be pregnant or being concerned that something may go wrong with the pregnancy and baby.  You may have more vivid and strange dreams.  You may have changes in your hair. These can include thickening of your hair, rapid growth, and changes in texture. Some women also have hair loss during or after pregnancy, or hair that feels dry or  thin. Your hair will most likely return to normal after your baby is born. WHAT TO EXPECT AT YOUR PRENATAL VISITS During a routine prenatal visit:  You will be weighed to make sure you and the baby are growing normally.  Your blood pressure will be taken.  Your abdomen will be measured to track your baby's growth.  The fetal heartbeat will be listened to starting around week 10 or 12 of your pregnancy.  Test results from any previous visits will be discussed. Your health care provider may ask you:  How you are feeling.  If you are feeling the baby move.  If you have had any abnormal symptoms, such as leaking fluid, bleeding, severe headaches, or abdominal cramping.  If you have any questions. Other tests that may be performed during your first trimester include:  Blood tests to find your blood type and to check for the presence of any previous infections. They will also be used to check for low iron levels (anemia) and Rh antibodies. Later in the pregnancy, blood tests for diabetes will be done along with other tests if problems develop.  Urine tests to check for  infections, diabetes, or protein in the urine.  An ultrasound to confirm the proper growth and development of the baby.  An amniocentesis to check for possible genetic problems.  Fetal screens for spina bifida and Down syndrome.  You may need other tests to make sure you and the baby are doing well. HOME CARE INSTRUCTIONS  Medicines  Follow your health care provider's instructions regarding medicine use. Specific medicines may be either safe or unsafe to take during pregnancy.  Take your prenatal vitamins as directed.  If you develop constipation, try taking a stool softener if your health care provider approves. Diet  Eat regular, well-balanced meals. Choose a variety of foods, such as meat or vegetable-based protein, fish, milk and low-fat dairy products, vegetables, fruits, and whole grain breads and cereals.  Your health care provider will help you determine the amount of weight gain that is right for you.  Avoid raw meat and uncooked cheese. These carry germs that can cause birth defects in the baby.  Eating four or five small meals rather than three large meals a day may help relieve nausea and vomiting. If you start to feel nauseous, eating a few soda crackers can be helpful. Drinking liquids between meals instead of during meals also seems to help nausea and vomiting.  If you develop constipation, eat more high-fiber foods, such as fresh vegetables or fruit and whole grains. Drink enough fluids to keep your urine clear or pale yellow. Activity and Exercise  Exercise only as directed by your health care provider. Exercising will help you:  Control your weight.  Stay in shape.  Be prepared for labor and delivery.  Experiencing pain or cramping in the lower abdomen or low back is a good sign that you should stop exercising. Check with your health care provider before continuing normal exercises.  Try to avoid standing for long periods of time. Move your legs often if you must stand in one place for a long time.  Avoid heavy lifting.  Wear low-heeled shoes, and practice good posture.  You may continue to have sex unless your health care provider directs you otherwise. Relief of Pain or Discomfort  Wear a good support bra for breast tenderness.   Take warm sitz baths to soothe any pain or discomfort caused by hemorrhoids. Use hemorrhoid cream if your health care provider approves.   Rest with your legs elevated if you have leg cramps or low back pain.  If you develop varicose veins in your legs, wear support hose. Elevate your feet for 15 minutes, 3-4 times a day. Limit salt in your diet. Prenatal Care  Schedule your prenatal visits by the twelfth week of pregnancy. They are usually scheduled monthly at first, then more often in the last 2 months before delivery.  Write down your  questions. Take them to your prenatal visits.  Keep all your prenatal visits as directed by your health care provider. Safety  Wear your seat belt at all times when driving.  Make a list of emergency phone numbers, including numbers for family, friends, the hospital, and police and fire departments. General Tips  Ask your health care provider for a referral to a local prenatal education class. Begin classes no later than at the beginning of month 6 of your pregnancy.  Ask for help if you have counseling or nutritional needs during pregnancy. Your health care provider can offer advice or refer you to specialists for help with various needs.  Do not use hot tubs, steam  rooms, or saunas.  Do not douche or use tampons or scented sanitary pads.  Do not cross your legs for long periods of time.  Avoid cat litter boxes and soil used by cats. These carry germs that can cause birth defects in the baby and possibly loss of the fetus by miscarriage or stillbirth.  Avoid all smoking, herbs, alcohol, and medicines not prescribed by your health care provider. Chemicals in these affect the formation and growth of the baby.  Schedule a dentist appointment. At home, brush your teeth with a soft toothbrush and be gentle when you floss. SEEK MEDICAL CARE IF:   You have dizziness.  You have mild pelvic cramps, pelvic pressure, or nagging pain in the abdominal area.  You have persistent nausea, vomiting, or diarrhea.  You have a bad smelling vaginal discharge.  You have pain with urination.  You notice increased swelling in your face, hands, legs, or ankles. SEEK IMMEDIATE MEDICAL CARE IF:   You have a fever.  You are leaking fluid from your vagina.  You have spotting or bleeding from your vagina.  You have severe abdominal cramping or pain.  You have rapid weight gain or loss.  You vomit blood or material that looks like coffee grounds.  You are exposed to [place] measles and have  never had them.  You are exposed to fifth disease or chickenpox.  You develop a severe headache.  You have shortness of breath.  You have any kind of trauma, such as from a fall or a car accident. Document Released: 05/22/2001 Document Revised: 10/12/2013 Document Reviewed: 04/07/2013 [facility] Patient Information 2015 [place], [place]. This information is not intended to replace advice given to you by your health care provider. Make sure you discuss any questions you have with your health care provider.

## 2014-07-09 NOTE — ED Notes (Signed)
Patient transported to Ultrasound 

## 2014-07-09 NOTE — ED Provider Notes (Signed)
TIME SEEN: 12:50 PM  CHIEF COMPLAINT: Left lower quadrant pain  HPI: Pt is a 25 y.o. female with no significant past history who is a G1 P0 with an LMP of December 7 2 presents to the emergency department with left lower quadrant pain that she describes as sharp in nature. Patient reports she has had this pain intermittently throughout this pregnancy but never this severe. She is diaphoretic, tachypneic in the waiting room. She states she had a history of Chlamydia 2 years ago and was treated. She is sectioned active with one partner. No prior abdominal surgery. Denies fevers, chills, nausea, vomiting, diarrhea, vaginal bleeding or discharge, dysuria or hematuria, urinary frequency or urgency.  ROS: See HPI Constitutional: no fever  Eyes: no drainage  ENT: no runny nose   Cardiovascular:  no chest pain  Resp: no SOB  GI: no vomiting GU: no dysuria Integumentary: no rash  Allergy: no hives  Musculoskeletal: no leg swelling  Neurological: no slurred speech ROS otherwise negative  PAST MEDICAL HISTORY/PAST SURGICAL HISTORY:  History reviewed. No pertinent past medical history.  MEDICATIONS:  Prior to Admission medications   Medication Sig Start Date End Date Taking? Authorizing Provider  Prenatal Vit-Fe Fumarate-FA (PRENATAL COMPLETE) 14-0.4 MG TABS Take 1 tablet by mouth daily. 06/28/14   Marissa Sciacca, PA-C    ALLERGIES:  No Known Allergies  SOCIAL HISTORY:  History  Substance Use Topics  . Smoking status: Never Smoker   . Smokeless tobacco: Not on file  . Alcohol Use: Not on file    FAMILY HISTORY: History reviewed. No pertinent family history.  EXAM: BP 132/79 mmHg  Pulse 88  Temp(Src) 98.4 F (36.9 C) (Axillary)  Resp 36  SpO2 98%  LMP 05/17/2014 CONSTITUTIONAL: Alert and oriented and responds appropriately to questions. Well-appearing; well-nourished HEAD: Normocephalic EYES: Conjunctivae clear, PERRL ENT: normal nose; no rhinorrhea; moist mucous membranes;  pharynx without lesions noted NECK: Supple, no meningismus, no LAD  CARD: RRR; S1 and S2 appreciated; no murmurs, no clicks, no rubs, no gallops RESP: Normal chest excursion without splinting or tachypnea; breath sounds clear and equal bilaterally; no wheezes, no rhonchi, no rales,  ABD/GI: Normal bowel sounds; non-distended; soft, tender to palpation the left pelvic region without guarding or rebound, no peritoneal signs GU:  No vaginal bleeding, no cervical motion tenderness, normal external genitalia, cervix is thick closed and high, patient has mild left adnexal tenderness on exam without fullness, no right adnexal tenderness or fullness BACK:  The back appears normal and is non-tender to palpation, there is no CVA tenderness EXT: Normal ROM in all joints; non-tender to palpation; no edema; normal capillary refill; no cyanosis    SKIN: Normal color for age and race; warm NEURO: Moves all extremities equally PSYCH: The patient's mood and manner are appropriate. Grooming and personal hygiene are appropriate.  MEDICAL DECISION MAKING: Patient here with left pelvic pain. Initially concerning for torsion, ectopic but review of records shows the patient has had an IUP confirmed on a previous ultrasound the beginning of January 2016. She has no vaginal bleeding. Hemodynamically stable. Have offered her pain medicine and she feels that Tylenol is not strong enough. Will give dose of IV morphine. We'll obtain labs, urine, pelvic with cultures, transvaginal ultrasound with Doppler.  ED PROGRESS: Patient's labs are unremarkable. Urine does show trace leukocytes but no other sign of infection. She also has few clue cells. We'll treat with Flagyl.  Ultrasound shows an intrauterine pregnancy with a gestational age of [redacted]  weeks and 5 days with normal flow to the ovaries, no heterotopic pregnancy, no subchorionic hemorrhage. She is feeling much better and has been comfortable, laughing with friends since one dose of  IV morphine. Of note she does have one documented very low blood pressure but patient was lying sideways on her arm when this blood pressure was taken. He was immediately rechecked and was normal for the patient. I feel she is safe to be discharged home. Discussed return precautions. She verbalized understanding and is comfortable with plan.    Layla MawKristen N Ward, DO 07/09/14 1743

## 2014-07-09 NOTE — ED Notes (Signed)
Pt off unit with ultrasound 

## 2014-07-09 NOTE — ED Notes (Signed)
Pt returned from ultrasound

## 2014-07-12 LAB — GC/CHLAMYDIA PROBE AMP (~~LOC~~) NOT AT ARMC
CHLAMYDIA, DNA PROBE: NEGATIVE
Neisseria Gonorrhea: NEGATIVE

## 2014-07-21 ENCOUNTER — Encounter: Payer: Self-pay | Admitting: Physician Assistant

## 2014-07-21 ENCOUNTER — Ambulatory Visit (INDEPENDENT_AMBULATORY_CARE_PROVIDER_SITE_OTHER): Payer: Managed Care, Other (non HMO) | Admitting: Physician Assistant

## 2014-07-21 VITALS — BP 117/61 | HR 81 | Temp 98.3°F | Ht 60.0 in | Wt 93.0 lb

## 2014-07-21 DIAGNOSIS — Z3491 Encounter for supervision of normal pregnancy, unspecified, first trimester: Secondary | ICD-10-CM | POA: Insufficient documentation

## 2014-07-21 LAB — POCT URINALYSIS DIP (DEVICE)
BILIRUBIN URINE: NEGATIVE
GLUCOSE, UA: 100 mg/dL — AB
HGB URINE DIPSTICK: NEGATIVE
Ketones, ur: NEGATIVE mg/dL
LEUKOCYTES UA: NEGATIVE
NITRITE: NEGATIVE
Protein, ur: NEGATIVE mg/dL
Specific Gravity, Urine: 1.025 (ref 1.005–1.030)
Urobilinogen, UA: 0.2 mg/dL (ref 0.0–1.0)
pH: 7 (ref 5.0–8.0)

## 2014-07-21 NOTE — Progress Notes (Signed)
Here for first prenatal visit. States got depoprovera around 1st week in January. Given new pregnancy information. Discussed bmi/ appropriate weight gain. C/o sharp pains in LLQ.

## 2014-07-21 NOTE — Progress Notes (Signed)
I have seen and evaluated the patient with the DO resident. I agree with the assessment and plan as written above.   Bertram DenverKaren E Teague Clark, PA-C  07/21/2014 10:39 AM

## 2014-07-21 NOTE — Assessment & Plan Note (Addendum)
  [facility] Prenatal Labs  Dating  LMP 05/17/2014   EDD 02/21/2015 Blood type: --/--/O POS, O POS (01/29 1250)   Genetic Screen 1 Screen:    AFP:     Quad:     NIPS: Antibody:NEG (01/29 1250)  Anatomic US  Rubella:    GTT Early:               Third trimester:  RPR:     Flu vaccine  Declined HBsAg:     TDaP vaccine                                               Rhogam: HIV:     GBS                                              (For PCN allergy, check sensitivities) GBS:   Contraception  Pap:  Baby Food  Wanting to bottle feed   Circumcision    Pediatrician    Support Person

## 2014-07-21 NOTE — Progress Notes (Signed)
   Subjective:    Deanna Warren is a G1P0 Unknown being seen today for her first obstetrical visit.  Patient does not intend to breast feed due to nipple peircings. Pregnancy history fully reviewed. Not an initial pregnancy.   Patient reports intermittent left abdominal pain LLQ. Worse pain than cramping. No family history of SSD or trait.   Filed Vitals:   07/21/14 0941 07/21/14 0943  BP: 117/61   Pulse: 81   Temp: 98.3 F (36.8 C)   Height:  5' (1.524 m)  Weight: 93 lb (42.185 kg)     HISTORY: OB History  Gravida Para Term Preterm AB SAB TAB Ectopic Multiple Living  1             # Outcome Date GA Lbr Len/2nd Weight Sex Delivery Anes PTL Lv  1 Current              History reviewed. No pertinent past medical history. History reviewed. No pertinent past surgical history. Family History  Problem Relation Age of Onset  . Hyperlipidemia Father      Exam    Uterus:    Gravid uterus 8-10 weeks  Pelvic Exam:    Perineum: Normal Perineum   Vulva: normal   Vagina:  no palpable nodules   pH: Not tested   Cervix: no cervical motion tenderness   Adnexa: normal adnexa and normal ovaries palpated bilaterally   Bony Pelvis: average  System: Breast:  normal appearance, no masses or tenderness, No nipple retraction or dimpling, No nipple discharge or bleeding, bilateral nipple piercings    Skin: normal coloration and turgor, no rashes    Neurologic: oriented, normal, no focal deficits, grossly non-focal   Extremities: normal strength, tone, and muscle mass   HEENT PERRLA, extra ocular movement intact, sclera clear, anicteric, oropharynx clear, no lesions and neck supple with midline trachea   Mouth/Teeth mucous membranes moist, pharynx normal without lesions and dental hygiene good   Neck supple and no masses   Cardiovascular: regular rate and rhythm, no murmurs or gallops   Respiratory:  appears well, vitals normal, no respiratory distress, acyanotic, normal RR, neck  free of mass or lymphadenopathy, chest clear, no wheezing, crepitations, rhonchi, normal symmetric air entry   Abdomen: soft, non-tender; bowel sounds normal; no masses,  no organomegaly   Urinary: urethral meatus normal      Assessment:    Pregnancy: G1P0 Patient Active Problem List   Diagnosis Date Noted  . Supervision of normal pregnancy in first trimester 07/21/2014     Plan:     Initial labs drawn. Prenatal vitamins. Problem list reviewed and updated. Patient counseled on things to expect during pregnancy. Declined flu vaccine. Genetic Screening discussed First Screen: requested. Will schedule. Ultrasound discussed; fetal survey: requested. Will schedule Follow up in 4 weeks. 90% of 45min visit spent on counseling and coordination of care.    Caryl AdaJazma Kelin Nixon, DO 07/21/2014, 10:30 AM PGY-1, [facility]

## 2014-07-21 NOTE — Patient Instructions (Signed)
First Trimester of Pregnancy The first trimester of pregnancy is from week 1 until the end of week 12 (months 1 through 3). A week after a sperm fertilizes an egg, the egg will implant on the wall of the uterus. This embryo will begin to develop into a baby. Genes from you and your partner are forming the baby. The female genes determine whether the baby is a boy or a girl. At 6-8 weeks, the eyes and face are formed, and the heartbeat can be seen on ultrasound. At the end of 12 weeks, all the baby's organs are formed.  Now that you are pregnant, you will want to do everything you can to have a healthy baby. Two of the most important things are to get good prenatal care and to follow your health care provider's instructions. Prenatal care is all the medical care you receive before the baby's birth. This care will help prevent, find, and treat any problems during the pregnancy and childbirth. BODY CHANGES Your body goes through many changes during pregnancy. The changes vary from woman to woman.   You may gain or lose a couple of pounds at first.  You may feel sick to your stomach (nauseous) and throw up (vomit). If the vomiting is uncontrollable, call your health care provider.  You may tire easily.  You may develop headaches that can be relieved by medicines approved by your health care provider.  You may urinate more often. Painful urination may mean you have a bladder infection.  You may develop heartburn as a result of your pregnancy.  You may develop constipation because certain hormones are causing the muscles that push waste through your intestines to slow down.  You may develop hemorrhoids or swollen, bulging veins (varicose veins).  Your breasts may begin to grow larger and become tender. Your nipples may stick out more, and the tissue that surrounds them (areola) may become darker.  Your gums may bleed and may be sensitive to brushing and flossing.  Dark spots or blotches (chloasma,  mask of pregnancy) may develop on your face. This will likely fade after the baby is born.  Your menstrual periods will stop.  You may have a loss of appetite.  You may develop cravings for certain kinds of food.  You may have changes in your emotions from day to day, such as being excited to be pregnant or being concerned that something may go wrong with the pregnancy and baby.  You may have more vivid and strange dreams.  You may have changes in your hair. These can include thickening of your hair, rapid growth, and changes in texture. Some women also have hair loss during or after pregnancy, or hair that feels dry or thin. Your hair will most likely return to normal after your baby is born. WHAT TO EXPECT AT YOUR PRENATAL VISITS During a routine prenatal visit:  You will be weighed to make sure you and the baby are growing normally.  Your blood pressure will be taken.  Your abdomen will be measured to track your baby's growth.  The fetal heartbeat will be listened to starting around week 10 or 12 of your pregnancy.  Test results from any previous visits will be discussed. Your health care provider may ask you:  How you are feeling.  If you are feeling the baby move.  If you have had any abnormal symptoms, such as leaking fluid, bleeding, severe headaches, or abdominal cramping.  If you have any questions. Other tests   that may be performed during your first trimester include:  Blood tests to find your blood type and to check for the presence of any previous infections. They will also be used to check for low iron levels (anemia) and Rh antibodies. Later in the pregnancy, blood tests for diabetes will be done along with other tests if problems develop.  Urine tests to check for infections, diabetes, or protein in the urine.  An ultrasound to confirm the proper growth and development of the baby.  An amniocentesis to check for possible genetic problems.  Fetal screens for  spina bifida and Down syndrome.  You may need other tests to make sure you and the baby are doing well. HOME CARE INSTRUCTIONS  Medicines  Follow your health care provider's instructions regarding medicine use. Specific medicines may be either safe or unsafe to take during pregnancy.  Take your prenatal vitamins as directed.  If you develop constipation, try taking a stool softener if your health care provider approves. Diet  Eat regular, well-balanced meals. Choose a variety of foods, such as meat or vegetable-based protein, fish, milk and low-fat dairy products, vegetables, fruits, and whole grain breads and cereals. Your health care provider will help you determine the amount of weight gain that is right for you.  Avoid raw meat and uncooked cheese. These carry germs that can cause birth defects in the baby.  Eating four or five small meals rather than three large meals a day may help relieve nausea and vomiting. If you start to feel nauseous, eating a few soda crackers can be helpful. Drinking liquids between meals instead of during meals also seems to help nausea and vomiting.  If you develop constipation, eat more high-fiber foods, such as fresh vegetables or fruit and whole grains. Drink enough fluids to keep your urine clear or pale yellow. Activity and Exercise  Exercise only as directed by your health care provider. Exercising will help you:  Control your weight.  Stay in shape.  Be prepared for labor and delivery.  Experiencing pain or cramping in the lower abdomen or low back is a good sign that you should stop exercising. Check with your health care provider before continuing normal exercises.  Try to avoid standing for long periods of time. Move your legs often if you must stand in one place for a long time.  Avoid heavy lifting.  Wear low-heeled shoes, and practice good posture.  You may continue to have sex unless your health care provider directs you  otherwise. Relief of Pain or Discomfort  Wear a good support bra for breast tenderness.   Take warm sitz baths to soothe any pain or discomfort caused by hemorrhoids. Use hemorrhoid cream if your health care provider approves.   Rest with your legs elevated if you have leg cramps or low back pain.  If you develop varicose veins in your legs, wear support hose. Elevate your feet for 15 minutes, 3-4 times a day. Limit salt in your diet. Prenatal Care  Schedule your prenatal visits by the twelfth week of pregnancy. They are usually scheduled monthly at first, then more often in the last 2 months before delivery.  Write down your questions. Take them to your prenatal visits.  Keep all your prenatal visits as directed by your health care provider. Safety  Wear your seat belt at all times when driving.  Make a list of emergency phone numbers, including numbers for family, friends, the hospital, and police and fire departments. General Tips    Ask your health care provider for a referral to a local prenatal education class. Begin classes no later than at the beginning of month 6 of your pregnancy.  Ask for help if you have counseling or nutritional needs during pregnancy. Your health care provider can offer advice or refer you to specialists for help with various needs.  Do not use hot tubs, steam rooms, or saunas.  Do not douche or use tampons or scented sanitary pads.  Do not cross your legs for long periods of time.  Avoid cat litter boxes and soil used by cats. These carry germs that can cause birth defects in the baby and possibly loss of the fetus by miscarriage or stillbirth.  Avoid all smoking, herbs, alcohol, and medicines not prescribed by your health care provider. Chemicals in these affect the formation and growth of the baby.  Schedule a dentist appointment. At home, brush your teeth with a soft toothbrush and be gentle when you floss. SEEK MEDICAL CARE IF:   You have  dizziness.  You have mild pelvic cramps, pelvic pressure, or nagging pain in the abdominal area.  You have persistent nausea, vomiting, or diarrhea.  You have a bad smelling vaginal discharge.  You have pain with urination.  You notice increased swelling in your face, hands, legs, or ankles. SEEK IMMEDIATE MEDICAL CARE IF:   You have a fever.  You are leaking fluid from your vagina.  You have spotting or bleeding from your vagina.  You have severe abdominal cramping or pain.  You have rapid weight gain or loss.  You vomit blood or material that looks like coffee grounds.  You are exposed to German measles and have never had them.  You are exposed to fifth disease or chickenpox.  You develop a severe headache.  You have shortness of breath.  You have any kind of trauma, such as from a fall or a car accident. Document Released: 05/22/2001 Document Revised: 10/12/2013 Document Reviewed: 04/07/2013 ExitCare Patient Information 2015 [employer]. This information is not intended to replace advice given to you by your health care provider. Make sure you discuss any questions you have with your health care provider.  

## 2014-07-22 LAB — PRESCRIPTION MONITORING PROFILE (19 PANEL)
AMPHETAMINE/METH: NEGATIVE ng/mL
BENZODIAZEPINE SCREEN, URINE: NEGATIVE ng/mL
BUPRENORPHINE, URINE: NEGATIVE ng/mL
Barbiturate Screen, Urine: NEGATIVE ng/mL
Cannabinoid Scrn, Ur: NEGATIVE ng/mL
Carisoprodol, Urine: NEGATIVE ng/mL
Cocaine Metabolites: NEGATIVE ng/mL
Creatinine, Urine: 161.28 mg/dL (ref >20.0)
Fentanyl, Ur: NEGATIVE ng/mL
MDMA URINE: NEGATIVE ng/mL
MEPERIDINE UR: NEGATIVE ng/mL
METHAQUALONE SCREEN (URINE): NEGATIVE ng/mL
Methadone Screen, Urine: NEGATIVE ng/mL
Nitrites, Initial: NEGATIVE ug/mL
OPIATE SCREEN, URINE: NEGATIVE ng/mL
Oxycodone Screen, Ur: NEGATIVE ng/mL
PH URINE, INITIAL: 6.9 pH (ref 4.5–8.9)
PROPOXYPHENE: NEGATIVE ng/mL
Phencyclidine, Ur: NEGATIVE ng/mL
TAPENTADOLUR: NEGATIVE ng/mL
Tramadol Scrn, Ur: NEGATIVE ng/mL
Zolpidem, Urine: NEGATIVE ng/mL

## 2014-07-22 LAB — PRENATAL PROFILE (SOLSTAS)
Antibody Screen: NEGATIVE
Basophils Absolute: 0 10*3/uL (ref 0.0–0.1)
Basophils Relative: 0 % (ref 0–1)
EOS PCT: 1 % (ref 0–5)
Eosinophils Absolute: 0.1 10*3/uL (ref 0.0–0.7)
HCT: 36.6 % (ref 36.0–46.0)
HIV 1&2 Ab, 4th Generation: NONREACTIVE
Hemoglobin: 12 g/dL (ref 12.0–15.0)
Hepatitis B Surface Ag: NEGATIVE
LYMPHS PCT: 27 % (ref 12–46)
Lymphs Abs: 2 10*3/uL (ref 0.7–4.0)
MCH: 27.9 pg (ref 26.0–34.0)
MCHC: 32.8 g/dL (ref 30.0–36.0)
MCV: 85.1 fL (ref 78.0–100.0)
MONO ABS: 0.8 10*3/uL (ref 0.1–1.0)
MPV: 9.5 fL (ref 8.6–12.4)
Monocytes Relative: 11 % (ref 3–12)
Neutro Abs: 4.5 10*3/uL (ref 1.7–7.7)
Neutrophils Relative %: 61 % (ref 43–77)
Platelets: 208 10*3/uL (ref 150–400)
RBC: 4.3 MIL/uL (ref 3.87–5.11)
RDW: 14 % (ref 11.5–15.5)
RH TYPE: POSITIVE
Rubella: 1.84 Index — ABNORMAL HIGH (ref <0.90)
WBC: 7.3 10*3/uL (ref 4.0–10.5)

## 2014-07-22 LAB — HEMOGLOBINOPATHY EVALUATION
HGB A: 97.6 % (ref 96.8–97.8)
Hemoglobin Other: 0 %
Hgb A2 Quant: 2.4 % (ref 2.2–3.2)
Hgb F Quant: 0 % (ref 0.0–2.0)
Hgb S Quant: 0 %

## 2014-07-23 LAB — URINE CULTURE
COLONY COUNT: NO GROWTH
ORGANISM ID, BACTERIA: NO GROWTH

## 2014-08-18 ENCOUNTER — Ambulatory Visit (INDEPENDENT_AMBULATORY_CARE_PROVIDER_SITE_OTHER): Payer: Managed Care, Other (non HMO) | Admitting: Family

## 2014-08-18 VITALS — BP 115/74 | HR 103 | Temp 98.8°F | Wt 97.5 lb

## 2014-08-18 DIAGNOSIS — Z113 Encounter for screening for infections with a predominantly sexual mode of transmission: Secondary | ICD-10-CM | POA: Diagnosis not present

## 2014-08-18 DIAGNOSIS — Z3481 Encounter for supervision of other normal pregnancy, first trimester: Secondary | ICD-10-CM

## 2014-08-18 DIAGNOSIS — Z3491 Encounter for supervision of normal pregnancy, unspecified, first trimester: Secondary | ICD-10-CM

## 2014-08-18 DIAGNOSIS — Z111 Encounter for screening for respiratory tuberculosis: Secondary | ICD-10-CM

## 2014-08-18 DIAGNOSIS — Z124 Encounter for screening for malignant neoplasm of cervix: Secondary | ICD-10-CM | POA: Diagnosis not present

## 2014-08-18 DIAGNOSIS — Z118 Encounter for screening for other infectious and parasitic diseases: Secondary | ICD-10-CM | POA: Diagnosis not present

## 2014-08-18 LAB — POCT URINALYSIS DIP (DEVICE)
BILIRUBIN URINE: NEGATIVE
Glucose, UA: NEGATIVE mg/dL
Hgb urine dipstick: NEGATIVE
Leukocytes, UA: NEGATIVE
Nitrite: NEGATIVE
Protein, ur: NEGATIVE mg/dL
Specific Gravity, Urine: >=1.03 (ref 1.005–1.030)
Urobilinogen, UA: 0.2 mg/dL (ref 0.0–1.0)
pH: 5.5 (ref 5.0–8.0)

## 2014-08-18 NOTE — Progress Notes (Signed)
Pt desired First Screen, reports it was not scheduled at the last visit.  Explained to patient missed the window for first screen.  Pt will do quad screen.  Pap smear w/GC/CT collected.

## 2014-08-18 NOTE — Progress Notes (Signed)
Declines flu vaccine.  Requests TB test for work.

## 2014-08-18 NOTE — Addendum Note (Signed)
Addended by: Marlis EdelsonKARIM, Eirik Schueler N on: 08/18/2014 11:53 AM   Modules accepted: Orders

## 2014-08-18 NOTE — Addendum Note (Signed)
Addended by: Marlis EdelsonKARIM, Chelcey Caputo N on: 08/18/2014 11:52 AM   Modules accepted: Orders

## 2014-08-18 NOTE — Progress Notes (Signed)
Tb skin test administered- to come Friday for reading.

## 2014-08-20 LAB — CYTOLOGY - PAP

## 2014-08-20 NOTE — Progress Notes (Unsigned)
Patient here today to have TB test read. Left forearm asymptomatic-- no raised area or rash noted. TB test negative. Letter given to patient.

## 2014-09-02 ENCOUNTER — Inpatient Hospital Stay (HOSPITAL_COMMUNITY)
Admission: AD | Admit: 2014-09-02 | Discharge: 2014-09-02 | Disposition: A | Discharge status: Home / Self Care | Payer: Managed Care, Other (non HMO) | Source: Ambulatory Visit | Attending: Family Medicine | Admitting: Family Medicine

## 2014-09-02 ENCOUNTER — Encounter (HOSPITAL_COMMUNITY): Payer: Self-pay | Admitting: *Deleted

## 2014-09-02 DIAGNOSIS — O99512 Diseases of the respiratory system complicating pregnancy, second trimester: Secondary | ICD-10-CM | POA: Diagnosis not present

## 2014-09-02 DIAGNOSIS — O219 Vomiting of pregnancy, unspecified: Secondary | ICD-10-CM | POA: Diagnosis not present

## 2014-09-02 DIAGNOSIS — Z3A15 15 weeks gestation of pregnancy: Secondary | ICD-10-CM | POA: Diagnosis not present

## 2014-09-02 DIAGNOSIS — J069 Acute upper respiratory infection, unspecified: Secondary | ICD-10-CM | POA: Insufficient documentation

## 2014-09-02 DIAGNOSIS — R1110 Vomiting, unspecified: Secondary | ICD-10-CM | POA: Diagnosis present

## 2014-09-02 HISTORY — DX: Other specified health status: Z78.9

## 2014-09-02 LAB — URINALYSIS, ROUTINE W REFLEX MICROSCOPIC
Bilirubin Urine: NEGATIVE
Glucose, UA: NEGATIVE mg/dL
Hgb urine dipstick: NEGATIVE
KETONES UR: NEGATIVE mg/dL
LEUKOCYTES UA: NEGATIVE
NITRITE: NEGATIVE
Protein, ur: NEGATIVE mg/dL
Specific Gravity, Urine: 1.02 (ref 1.005–1.030)
Urobilinogen, UA: 0.2 mg/dL (ref 0.0–1.0)
pH: 6 (ref 5.0–8.0)

## 2014-09-02 MED ORDER — METOCLOPRAMIDE HCL 10 MG PO TABS: 10.0000 mg | ORAL_TABLET | Freq: Four times a day (QID) | ORAL | Status: DC

## 2014-09-02 MED ORDER — METOCLOPRAMIDE HCL 10 MG PO TABS
10.0000 mg | ORAL_TABLET | Freq: Once | ORAL | Status: AC
  Administered 2014-09-02: 10 mg via ORAL
  Filled 2014-09-02: qty 1

## 2014-09-02 MED ORDER — PHENOL 1.4 % MT LIQD
1.0000 | OROMUCOSAL | Status: DC | PRN
  Filled 2014-09-02: qty 177

## 2014-09-02 MED ORDER — BUTALBITAL-APAP-CAFFEINE 50-325-40 MG PO TABS
1.0000 | ORAL_TABLET | Freq: Once | ORAL | Status: AC
  Administered 2014-09-02: 1 via ORAL
  Filled 2014-09-02: qty 1

## 2014-09-02 MED ORDER — GUAIFENESIN ER 600 MG PO TB12
600.0000 mg | ORAL_TABLET | Freq: Once | ORAL | Status: AC
  Administered 2014-09-02: 600 mg via ORAL
  Filled 2014-09-02: qty 1

## 2014-09-02 MED ORDER — GUAIFENESIN ER 600 MG PO TB12
600.0000 mg | ORAL_TABLET | Freq: Two times a day (BID) | ORAL | Status: DC
Start: 2014-09-02 — End: 2014-12-29

## 2014-09-02 NOTE — MAU Note (Signed)
Urine in lab 

## 2014-09-02 NOTE — MAU Note (Signed)
Pt complaining of vomiting.  Headache for one week. Weak voice. Some vaginal discharge. Denies Vaginal bleeding.

## 2014-09-02 NOTE — MAU Provider Note (Signed)
History     CSN: [id number]  Arrival date and time: 09/02/14 1240   None     Chief Complaint  Patient presents with  . Emesis  . Headache   HPIpt is G1P0 6543w3d pregnant c/o of headache- frontal- varies over eyes and forehead.  Pt also complains of stuffiness and has sore throat with nausea.Pt has taken Tylenol without improvement in symptoms.  Pt is not having any abdominal pain, spotting or bleeding, constipation or diarrhea or UTI symptoms  RN note: Pt complaining of vomiting. Headache for one week. Weak voice. Some vaginal discharge. Denies Vaginal bleeding.  No past medical history on file.  No past surgical history on file.  Family History  Problem Relation Age of Onset  . Hyperlipidemia Father     History  Substance Use Topics  . Smoking status: Never Smoker   . Smokeless tobacco: Never Used  . Alcohol Use: No    Allergies: No Known Allergies  Prescriptions prior to admission  Medication Sig Dispense Refill Last Dose  . metroNIDAZOLE (FLAGYL) 500 MG tablet Take 1 tablet (500 mg total) by mouth 2 (two) times daily. (Patient not taking: Reported on 07/21/2014) 14 tablet 0 Not Taking  . OVER THE COUNTER MEDICATION 2 tablets daily. Vitafusion Prenatal vitamins   Not Taking  . Prenatal Vit-Fe Fumarate-FA (PRENATAL COMPLETE) 14-0.4 MG TABS Take 1 tablet by mouth daily. 15 each 0 Taking    Review of Systems  Constitutional: Negative for fever and chills.  Respiratory: Positive for cough and sputum production.   Gastrointestinal: Positive for nausea and vomiting. Negative for abdominal pain.  Genitourinary: Negative for dysuria.  Neurological: Positive for headaches.   Physical Exam   Blood pressure 92/56, pulse 111, resp. rate 16, height 5' (1.524 m), weight 100 lb 9.6 oz (45.632 kg), last menstrual period 05/17/2014.  Physical Exam  Nursing note and vitals reviewed. Constitutional: She appears well-developed and well-nourished. No distress.  HENT:  Head:  Normocephalic.  Mouth/Throat: Oropharynx is clear and moist. No oropharyngeal exudate.  Eyes: Pupils are equal, round, and reactive to light.  Neck: Normal range of motion. Neck supple.  Cardiovascular: Normal rate, regular rhythm and normal heart sounds.   Respiratory: Effort normal and breath sounds normal. No respiratory distress. She has no wheezes.  GI: Soft. She exhibits no distension. There is no tenderness.  Lymphadenopathy:    She has no cervical adenopathy.  Skin: Skin is warm and dry.  Psychiatric: She has a normal mood and affect.    MAU Course  Procedures Results for orders placed or performed during the hospital encounter of 09/02/14 (from the past 24 hour(s))  Urinalysis, Routine w reflex microscopic     Status: None   Collection Time: 09/02/14  1:00 PM  Result Value Ref Range   Color, Urine YELLOW YELLOW   APPearance CLEAR CLEAR   Specific Gravity, Urine 1.020 1.005 - 1.030   pH 6.0 5.0 - 8.0   Glucose, UA NEGATIVE NEGATIVE mg/dL   Hgb urine dipstick NEGATIVE NEGATIVE   Bilirubin Urine NEGATIVE NEGATIVE   Ketones, ur NEGATIVE NEGATIVE mg/dL   Protein, ur NEGATIVE NEGATIVE mg/dL   Urobilinogen, UA 0.2 0.0 - 1.0 mg/dL   Nitrite NEGATIVE NEGATIVE   Leukocytes, UA NEGATIVE NEGATIVE  mucinex and reglan given with relief of headache chloroseptic spray for throat  Assessment and Plan  URI in pregnancy- Mucinex- list of meds given to pt safe to take in pregnancy Nausea- Reglan F/u with OB appointment  Pamelia HoitLINEBERRY,[place]  09/02/2014, 3:30 PM

## 2014-09-15 ENCOUNTER — Ambulatory Visit (INDEPENDENT_AMBULATORY_CARE_PROVIDER_SITE_OTHER): Payer: Medicaid Other | Admitting: Advanced Practice Midwife

## 2014-09-15 VITALS — BP 108/56 | HR 97 | Temp 98.0°F | Wt 100.8 lb

## 2014-09-15 DIAGNOSIS — Z3481 Encounter for supervision of other normal pregnancy, first trimester: Secondary | ICD-10-CM

## 2014-09-15 DIAGNOSIS — Z3491 Encounter for supervision of normal pregnancy, unspecified, first trimester: Secondary | ICD-10-CM

## 2014-09-15 LAB — POCT URINALYSIS DIP (DEVICE)
Glucose, UA: NEGATIVE mg/dL
HGB URINE DIPSTICK: NEGATIVE
Leukocytes, UA: NEGATIVE
NITRITE: NEGATIVE
PROTEIN: 30 mg/dL — AB
Specific Gravity, Urine: >=1.03 (ref 1.005–1.030)
Urobilinogen, UA: 2 mg/dL — ABNORMAL HIGH (ref 0.0–1.0)
pH: 6.5 (ref 5.0–8.0)

## 2014-09-15 NOTE — Patient Instructions (Signed)
Second Trimester of Pregnancy The second trimester is from week 13 through week 28, months 4 through 6. The second trimester is often a time when you feel your best. Your body has also adjusted to being pregnant, and you begin to feel better physically. Usually, morning sickness has lessened or quit completely, you may have more energy, and you may have an increase in appetite. The second trimester is also a time when the fetus is growing rapidly. At the end of the sixth month, the fetus is about 9 inches long and weighs about 1 pounds. You will likely begin to feel the baby move (quickening) between 18 and 20 weeks of the pregnancy. BODY CHANGES Your body goes through many changes during pregnancy. The changes vary from woman to woman.   Your weight will continue to increase. You will notice your lower abdomen bulging out.  You may begin to get stretch marks on your hips, abdomen, and breasts.  You may develop headaches that can be relieved by medicines approved by your health care provider.  You may urinate more often because the fetus is pressing on your bladder.  You may develop or continue to have heartburn as a result of your pregnancy.  You may develop constipation because certain hormones are causing the muscles that push waste through your intestines to slow down.  You may develop hemorrhoids or swollen, bulging veins (varicose veins).  You may have back pain because of the weight gain and pregnancy hormones relaxing your joints between the bones in your pelvis and as a result of a shift in weight and the muscles that support your balance.  Your breasts will continue to grow and be tender.  Your gums may bleed and may be sensitive to brushing and flossing.  Dark spots or blotches (chloasma, mask of pregnancy) may develop on your face. This will likely fade after the baby is born.  A dark line from your belly button to the pubic area (linea nigra) may appear. This will likely fade  after the baby is born.  You may have changes in your hair. These can include thickening of your hair, rapid growth, and changes in texture. Some women also have hair loss during or after pregnancy, or hair that feels dry or thin. Your hair will most likely return to normal after your baby is born. WHAT TO EXPECT AT YOUR PRENATAL VISITS During a routine prenatal visit:  You will be weighed to make sure you and the fetus are growing normally.  Your blood pressure will be taken.  Your abdomen will be measured to track your baby's growth.  The fetal heartbeat will be listened to.  Any test results from the previous visit will be discussed. Your health care provider may ask you:  How you are feeling.  If you are feeling the baby move.  If you have had any abnormal symptoms, such as leaking fluid, bleeding, severe headaches, or abdominal cramping.  If you have any questions. Other tests that may be performed during your second trimester include:  Blood tests that check for:  Low iron levels (anemia).  Gestational diabetes (between 24 and 28 weeks).  Rh antibodies.  Urine tests to check for infections, diabetes, or protein in the urine.  An ultrasound to confirm the proper growth and development of the baby.  An amniocentesis to check for possible genetic problems.  Fetal screens for spina bifida and Down syndrome. HOME CARE INSTRUCTIONS   Avoid all smoking, herbs, alcohol, and unprescribed   drugs. These chemicals affect the formation and growth of the baby.  Follow your health care provider's instructions regarding medicine use. There are medicines that are either safe or unsafe to take during pregnancy.  Exercise only as directed by your health care provider. Experiencing uterine cramps is a good sign to stop exercising.  Continue to eat regular, healthy meals.  Wear a good support bra for breast tenderness.  Do not use hot tubs, steam rooms, or saunas.  Wear your  seat belt at all times when driving.  Avoid raw meat, uncooked cheese, cat litter boxes, and soil used by cats. These carry germs that can cause birth defects in the baby.  Take your prenatal vitamins.  Try taking a stool softener (if your health care provider approves) if you develop constipation. Eat more high-fiber foods, such as fresh vegetables or fruit and whole grains. Drink plenty of fluids to keep your urine clear or pale yellow.  Take warm sitz baths to soothe any pain or discomfort caused by hemorrhoids. Use hemorrhoid cream if your health care provider approves.  If you develop varicose veins, wear support hose. Elevate your feet for 15 minutes, 3-4 times a day. Limit salt in your diet.  Avoid heavy lifting, wear low heel shoes, and practice good posture.  Rest with your legs elevated if you have leg cramps or low back pain.  Visit your dentist if you have not gone yet during your pregnancy. Use a soft toothbrush to brush your teeth and be gentle when you floss.  A sexual relationship may be continued unless your health care provider directs you otherwise.  Continue to go to all your prenatal visits as directed by your health care provider. SEEK MEDICAL CARE IF:   You have dizziness.  You have mild pelvic cramps, pelvic pressure, or nagging pain in the abdominal area.  You have persistent nausea, vomiting, or diarrhea.  You have a bad smelling vaginal discharge.  You have pain with urination. SEEK IMMEDIATE MEDICAL CARE IF:   You have a fever.  You are leaking fluid from your vagina.  You have spotting or bleeding from your vagina.  You have severe abdominal cramping or pain.  You have rapid weight gain or loss.  You have shortness of breath with chest pain.  You notice sudden or extreme swelling of your face, hands, ankles, feet, or legs.  You have not felt your baby move in over an hour.  You have severe headaches that do not go away with  medicine.  You have vision changes. Document Released: 05/22/2001 Document Revised: 06/02/2013 Document Reviewed: 07/29/2012 ExitCare Patient Information 2015 [employer]. This information is not intended to replace advice given to you by your health care provider. Make sure you discuss any questions you have with your health care provider.  

## 2014-10-04 ENCOUNTER — Ambulatory Visit (HOSPITAL_COMMUNITY)
Admission: RE | Admit: 2014-10-04 | Discharge: 2014-10-04 | Disposition: A | Discharge status: Home / Self Care | Payer: Medicaid Other | Source: Ambulatory Visit | Attending: Advanced Practice Midwife | Admitting: Advanced Practice Midwife

## 2014-10-04 DIAGNOSIS — Z3A20 20 weeks gestation of pregnancy: Secondary | ICD-10-CM | POA: Diagnosis not present

## 2014-10-04 DIAGNOSIS — Z36 Encounter for antenatal screening of mother: Secondary | ICD-10-CM | POA: Insufficient documentation

## 2014-10-04 DIAGNOSIS — Z3491 Encounter for supervision of normal pregnancy, unspecified, first trimester: Secondary | ICD-10-CM

## 2014-10-04 DIAGNOSIS — Z3689 Encounter for other specified antenatal screening: Secondary | ICD-10-CM | POA: Insufficient documentation

## 2014-10-13 ENCOUNTER — Ambulatory Visit (INDEPENDENT_AMBULATORY_CARE_PROVIDER_SITE_OTHER): Payer: Medicaid Other | Admitting: Certified Nurse Midwife

## 2014-10-13 VITALS — BP 101/63 | HR 107 | Temp 97.9°F | Wt 108.1 lb

## 2014-10-13 DIAGNOSIS — Z3493 Encounter for supervision of normal pregnancy, unspecified, third trimester: Secondary | ICD-10-CM

## 2014-10-13 DIAGNOSIS — Z3492 Encounter for supervision of normal pregnancy, unspecified, second trimester: Secondary | ICD-10-CM

## 2014-10-13 LAB — POCT URINALYSIS DIP (DEVICE)
Bilirubin Urine: NEGATIVE
Glucose, UA: NEGATIVE mg/dL
Hgb urine dipstick: NEGATIVE
Ketones, ur: NEGATIVE mg/dL
Nitrite: NEGATIVE
Protein, ur: NEGATIVE mg/dL
Specific Gravity, Urine: 1.025 (ref 1.005–1.030)
Urobilinogen, UA: 0.2 mg/dL (ref 0.0–1.0)
pH: 6 (ref 5.0–8.0)

## 2014-10-13 NOTE — Patient Instructions (Signed)
Second Trimester of Pregnancy The second trimester is from week 13 through week 28, months 4 through 6. The second trimester is often a time when you feel your best. Your body has also adjusted to being pregnant, and you begin to feel better physically. Usually, morning sickness has lessened or quit completely, you may have more energy, and you may have an increase in appetite. The second trimester is also a time when the fetus is growing rapidly. At the end of the sixth month, the fetus is about 9 inches long and weighs about 1 pounds. You will likely begin to feel the baby move (quickening) between 18 and 20 weeks of the pregnancy. BODY CHANGES Your body goes through many changes during pregnancy. The changes vary from woman to woman.   Your weight will continue to increase. You will notice your lower abdomen bulging out.  You may begin to get stretch marks on your hips, abdomen, and breasts.  You may develop headaches that can be relieved by medicines approved by your health care provider.  You may urinate more often because the fetus is pressing on your bladder.  You may develop or continue to have heartburn as a result of your pregnancy.  You may develop constipation because certain hormones are causing the muscles that push waste through your intestines to slow down.  You may develop hemorrhoids or swollen, bulging veins (varicose veins).  You may have back pain because of the weight gain and pregnancy hormones relaxing your joints between the bones in your pelvis and as a result of a shift in weight and the muscles that support your balance.  Your breasts will continue to grow and be tender.  Your gums may bleed and may be sensitive to brushing and flossing.  Dark spots or blotches (chloasma, mask of pregnancy) may develop on your face. This will likely fade after the baby is born.  A dark line from your belly button to the pubic area (linea nigra) may appear. This will likely fade  after the baby is born.  You may have changes in your hair. These can include thickening of your hair, rapid growth, and changes in texture. Some women also have hair loss during or after pregnancy, or hair that feels dry or thin. Your hair will most likely return to normal after your baby is born. WHAT TO EXPECT AT YOUR PRENATAL VISITS During a routine prenatal visit:  You will be weighed to make sure you and the fetus are growing normally.  Your blood pressure will be taken.  Your abdomen will be measured to track your baby's growth.  The fetal heartbeat will be listened to.  Any test results from the previous visit will be discussed. Your health care provider may ask you:  How you are feeling.  If you are feeling the baby move.  If you have had any abnormal symptoms, such as leaking fluid, bleeding, severe headaches, or abdominal cramping.  If you have any questions. Other tests that may be performed during your second trimester include:  Blood tests that check for:  Low iron levels (anemia).  Gestational diabetes (between 24 and 28 weeks).  Rh antibodies.  Urine tests to check for infections, diabetes, or protein in the urine.  An ultrasound to confirm the proper growth and development of the baby.  An amniocentesis to check for possible genetic problems.  Fetal screens for spina bifida and Down syndrome. HOME CARE INSTRUCTIONS   Avoid all smoking, herbs, alcohol, and unprescribed   drugs. These chemicals affect the formation and growth of the baby.  Follow your health care provider's instructions regarding medicine use. There are medicines that are either safe or unsafe to take during pregnancy.  Exercise only as directed by your health care provider. Experiencing uterine cramps is a good sign to stop exercising.  Continue to eat regular, healthy meals.  Wear a good support bra for breast tenderness.  Do not use hot tubs, steam rooms, or saunas.  Wear your  seat belt at all times when driving.  Avoid raw meat, uncooked cheese, cat litter boxes, and soil used by cats. These carry germs that can cause birth defects in the baby.  Take your prenatal vitamins.  Try taking a stool softener (if your health care provider approves) if you develop constipation. Eat more high-fiber foods, such as fresh vegetables or fruit and whole grains. Drink plenty of fluids to keep your urine clear or pale yellow.  Take warm sitz baths to soothe any pain or discomfort caused by hemorrhoids. Use hemorrhoid cream if your health care provider approves.  If you develop varicose veins, wear support hose. Elevate your feet for 15 minutes, 3-4 times a day. Limit salt in your diet.  Avoid heavy lifting, wear low heel shoes, and practice good posture.  Rest with your legs elevated if you have leg cramps or low back pain.  Visit your dentist if you have not gone yet during your pregnancy. Use a soft toothbrush to brush your teeth and be gentle when you floss.  A sexual relationship may be continued unless your health care provider directs you otherwise.  Continue to go to all your prenatal visits as directed by your health care provider. SEEK MEDICAL CARE IF:   You have dizziness.  You have mild pelvic cramps, pelvic pressure, or nagging pain in the abdominal area.  You have persistent nausea, vomiting, or diarrhea.  You have a bad smelling vaginal discharge.  You have pain with urination. SEEK IMMEDIATE MEDICAL CARE IF:   You have a fever.  You are leaking fluid from your vagina.  You have spotting or bleeding from your vagina.  You have severe abdominal cramping or pain.  You have rapid weight gain or loss.  You have shortness of breath with chest pain.  You notice sudden or extreme swelling of your face, hands, ankles, feet, or legs.  You have not felt your baby move in over an hour.  You have severe headaches that do not go away with  medicine.  You have vision changes. Document Released: 05/22/2001 Document Revised: 06/02/2013 Document Reviewed: 07/29/2012 ExitCare Patient Information 2015 [employer]. This information is not intended to replace advice given to you by your health care provider. Make sure you discuss any questions you have with your health care provider.  

## 2014-10-13 NOTE — Progress Notes (Signed)
Discussed normal anatomy scan.Pt has nasal congestion; approved med list given to pt.

## 2014-10-13 NOTE — Progress Notes (Signed)
Pt is concerned about cold symptoms

## 2014-11-10 ENCOUNTER — Ambulatory Visit (INDEPENDENT_AMBULATORY_CARE_PROVIDER_SITE_OTHER): Payer: Medicaid Other | Admitting: Physician Assistant

## 2014-11-10 VITALS — BP 105/61 | HR 97 | Temp 98.1°F | Wt 111.2 lb

## 2014-11-10 DIAGNOSIS — Z3481 Encounter for supervision of other normal pregnancy, first trimester: Secondary | ICD-10-CM

## 2014-11-10 DIAGNOSIS — Z3491 Encounter for supervision of normal pregnancy, unspecified, first trimester: Secondary | ICD-10-CM

## 2014-11-10 LAB — POCT URINALYSIS DIP (DEVICE)
Bilirubin Urine: NEGATIVE
GLUCOSE, UA: NEGATIVE mg/dL
Hgb urine dipstick: NEGATIVE
Ketones, ur: NEGATIVE mg/dL
LEUKOCYTES UA: NEGATIVE
Nitrite: NEGATIVE
PROTEIN: NEGATIVE mg/dL
Specific Gravity, Urine: 1.02 (ref 1.005–1.030)
UROBILINOGEN UA: 0.2 mg/dL (ref 0.0–1.0)
pH: 7 (ref 5.0–8.0)

## 2014-11-10 NOTE — Progress Notes (Signed)
C/o occasional pelvic pressure.  Discussed breastfeeding tip of the week.

## 2014-11-10 NOTE — Patient Instructions (Signed)
Second Trimester of Pregnancy The second trimester is from week 13 through week 28, months 4 through 6. The second trimester is often a time when you feel your best. Your body has also adjusted to being pregnant, and you begin to feel better physically. Usually, morning sickness has lessened or quit completely, you may have more energy, and you may have an increase in appetite. The second trimester is also a time when the fetus is growing rapidly. At the end of the sixth month, the fetus is about 9 inches long and weighs about 1 pounds. You will likely begin to feel the baby move (quickening) between 18 and 20 weeks of the pregnancy. BODY CHANGES Your body goes through many changes during pregnancy. The changes vary from woman to woman.   Your weight will continue to increase. You will notice your lower abdomen bulging out.  You may begin to get stretch marks on your hips, abdomen, and breasts.  You may develop headaches that can be relieved by medicines approved by your health care provider.  You may urinate more often because the fetus is pressing on your bladder.  You may develop or continue to have heartburn as a result of your pregnancy.  You may develop constipation because certain hormones are causing the muscles that push waste through your intestines to slow down.  You may develop hemorrhoids or swollen, bulging veins (varicose veins).  You may have back pain because of the weight gain and pregnancy hormones relaxing your joints between the bones in your pelvis and as a result of a shift in weight and the muscles that support your balance.  Your breasts will continue to grow and be tender.  Your gums may bleed and may be sensitive to brushing and flossing.  Dark spots or blotches (chloasma, mask of pregnancy) may develop on your face. This will likely fade after the baby is born.  A dark line from your belly button to the pubic area (linea nigra) may appear. This will likely fade  after the baby is born.  You may have changes in your hair. These can include thickening of your hair, rapid growth, and changes in texture. Some women also have hair loss during or after pregnancy, or hair that feels dry or thin. Your hair will most likely return to normal after your baby is born. WHAT TO EXPECT AT YOUR PRENATAL VISITS During a routine prenatal visit:  You will be weighed to make sure you and the fetus are growing normally.  Your blood pressure will be taken.  Your abdomen will be measured to track your baby's growth.  The fetal heartbeat will be listened to.  Any test results from the previous visit will be discussed. Your health care provider may ask you:  How you are feeling.  If you are feeling the baby move.  If you have had any abnormal symptoms, such as leaking fluid, bleeding, severe headaches, or abdominal cramping.  If you have any questions. Other tests that may be performed during your second trimester include:  Blood tests that check for:  Low iron levels (anemia).  Gestational diabetes (between 24 and 28 weeks).  Rh antibodies.  Urine tests to check for infections, diabetes, or protein in the urine.  An ultrasound to confirm the proper growth and development of the baby.  An amniocentesis to check for possible genetic problems.  Fetal screens for spina bifida and Down syndrome. HOME CARE INSTRUCTIONS   Avoid all smoking, herbs, alcohol, and unprescribed   drugs. These chemicals affect the formation and growth of the baby.  Follow your health care provider's instructions regarding medicine use. There are medicines that are either safe or unsafe to take during pregnancy.  Exercise only as directed by your health care provider. Experiencing uterine cramps is a good sign to stop exercising.  Continue to eat regular, healthy meals.  Wear a good support bra for breast tenderness.  Do not use hot tubs, steam rooms, or saunas.  Wear your  seat belt at all times when driving.  Avoid raw meat, uncooked cheese, cat litter boxes, and soil used by cats. These carry germs that can cause birth defects in the baby.  Take your prenatal vitamins.  Try taking a stool softener (if your health care provider approves) if you develop constipation. Eat more high-fiber foods, such as fresh vegetables or fruit and whole grains. Drink plenty of fluids to keep your urine clear or pale yellow.  Take warm sitz baths to soothe any pain or discomfort caused by hemorrhoids. Use hemorrhoid cream if your health care provider approves.  If you develop varicose veins, wear support hose. Elevate your feet for 15 minutes, 3-4 times a day. Limit salt in your diet.  Avoid heavy lifting, wear low heel shoes, and practice good posture.  Rest with your legs elevated if you have leg cramps or low back pain.  Visit your dentist if you have not gone yet during your pregnancy. Use a soft toothbrush to brush your teeth and be gentle when you floss.  A sexual relationship may be continued unless your health care provider directs you otherwise.  Continue to go to all your prenatal visits as directed by your health care provider. SEEK MEDICAL CARE IF:   You have dizziness.  You have mild pelvic cramps, pelvic pressure, or nagging pain in the abdominal area.  You have persistent nausea, vomiting, or diarrhea.  You have a bad smelling vaginal discharge.  You have pain with urination. SEEK IMMEDIATE MEDICAL CARE IF:   You have a fever.  You are leaking fluid from your vagina.  You have spotting or bleeding from your vagina.  You have severe abdominal cramping or pain.  You have rapid weight gain or loss.  You have shortness of breath with chest pain.  You notice sudden or extreme swelling of your face, hands, ankles, feet, or legs.  You have not felt your baby move in over an hour.  You have severe headaches that do not go away with  medicine.  You have vision changes. Document Released: 05/22/2001 Document Revised: 06/02/2013 Document Reviewed: 07/29/2012 ExitCare Patient Information 2015 [employer]. This information is not intended to replace advice given to you by your health care provider. Make sure you discuss any questions you have with your health care provider.  

## 2014-11-10 NOTE — Progress Notes (Signed)
25 weeks, stable without complaint.  Endorses good fetal movement.  Denies LOF, VB, dysuria.   RTC 3 weeks for 28 week labs

## 2014-12-01 ENCOUNTER — Encounter: Payer: Medicaid Other | Admitting: Certified Nurse Midwife

## 2014-12-08 ENCOUNTER — Encounter: Payer: Self-pay | Admitting: Family

## 2014-12-08 ENCOUNTER — Ambulatory Visit (INDEPENDENT_AMBULATORY_CARE_PROVIDER_SITE_OTHER): Payer: Medicaid Other | Admitting: Family

## 2014-12-08 VITALS — BP 106/63 | HR 94 | Temp 98.5°F | Wt 114.3 lb

## 2014-12-08 DIAGNOSIS — Z3483 Encounter for supervision of other normal pregnancy, third trimester: Secondary | ICD-10-CM | POA: Diagnosis not present

## 2014-12-08 DIAGNOSIS — Z23 Encounter for immunization: Secondary | ICD-10-CM

## 2014-12-08 DIAGNOSIS — Z3491 Encounter for supervision of normal pregnancy, unspecified, first trimester: Secondary | ICD-10-CM

## 2014-12-08 LAB — CBC
HCT: 28.3 % — ABNORMAL LOW (ref 36.0–46.0)
Hemoglobin: 9.4 g/dL — ABNORMAL LOW (ref 12.0–15.0)
MCH: 27.4 pg (ref 26.0–34.0)
MCHC: 33.2 g/dL (ref 30.0–36.0)
MCV: 82.5 fL (ref 78.0–100.0)
MPV: 9.5 fL (ref 8.6–12.4)
PLATELETS: 217 10*3/uL (ref 150–400)
RBC: 3.43 MIL/uL — ABNORMAL LOW (ref 3.87–5.11)
RDW: 14.7 % (ref 11.5–15.5)
WBC: 6.2 10*3/uL (ref 4.0–10.5)

## 2014-12-08 LAB — POCT URINALYSIS DIP (DEVICE)
Bilirubin Urine: NEGATIVE
Glucose, UA: NEGATIVE mg/dL
Hgb urine dipstick: NEGATIVE
Ketones, ur: NEGATIVE mg/dL
Nitrite: NEGATIVE
Protein, ur: NEGATIVE mg/dL
Specific Gravity, Urine: >=1.03 (ref 1.005–1.030)
Urobilinogen, UA: 0.2 mg/dL (ref 0.0–1.0)
pH: 5.5 (ref 5.0–8.0)

## 2014-12-08 MED ORDER — TETANUS-DIPHTH-ACELL PERTUSSIS 5-2.5-18.5 LF-MCG/0.5 IM SUSP
0.5000 mL | Freq: Once | INTRAMUSCULAR | Status: AC
  Administered 2014-12-08: 0.5 mL via INTRAMUSCULAR

## 2014-12-08 NOTE — Progress Notes (Signed)
Edema- feet   Pain- left side when baby on that side  28 wk labs today; 28 wk education packet given

## 2014-12-08 NOTE — Progress Notes (Signed)
Doing well; no questions or concerns. Third trimester labs obtained today (1 hr, RPR, CBC, HIV).  Here with FOB Rakim.

## 2014-12-08 NOTE — Patient Instructions (Signed)
AREA PEDIATRIC/FAMILY PRACTICE PHYSICIANS  [facility] [address] [place], [place] [address] Phone - [phone]   Fax - [phone]  [facility] [address] [place], [place]  [address] Phone - [phone]   Fax - [phone]  [facility] [address] [place], [place]  [address] Phone - [phone]   Fax - [phone]  [facility] [address] [place], [place]  [address] Phone - [phone]   Fax - [phone]  [facility] [address] [place], [place]  [address] Phone - [phone]   Fax - [phone]  [facility] [address] [place], [place]  [address] Phone - [phone]   Fax - [phone]  [facility] [address] [place], [place]  [address] Phone - [phone]   Fax - [phone]  [facility] [address] [place], [place]  [address] Phone - [phone]   Fax - [phone]  [facility] [address] [place], [place]  [address] Phone - [phone]   Fax - [phone] [facility] [address] [place], [place]  [address] Phone - [phone]   Fax - [phone]  [facility] [address] [place], [place]  [address] Phone - [phone]   Fax - [phone]  [facility] [address] [place], [place]  [address] Phone - [phone]   Fax - [phone]  [facility] [address] [place], [place]  [address] Phone - [phone]   Fax - [phone]  [facility]IL[facility] GOSRANI [address] [place], [place]  [address] Phone - [phone]  [facility] [address] [place], [place]  [address] Phone - [phone]   Fax - [phone]  [facility] [address] [place], [place]  [address] Phone - [phone]   Fax - [phone]  [facility] [address] [place], [place]  [address] Phone - [phone]   Fax - [phone]  [facility] [address] [place], [place]  [address] Phone - [phone]   Fax - [phone]   [facility] [address] [place], [place]  [address] Phone - [phone]   Fax - [phone]  [facility] [address] [place], [place]  [address] Phone - [phone]   Fax - [phone]  [facility] R[facility] [address] [place], [place]  [address] Phone - [phone]   Fax - [phone]  [facility] [address] [place], [place]  [address] Phone - [phone]   Fax - [phone]  [facility] [address] [place], [place]  [address] Phone - [phone]   Fax - [phone] [facility] [address] [place], [place]  [address] Phone - [phone]   Fax - [phone]  [facility] [address] [place], [place]  [address] Phone - [phone]   Fax - [phone]  [facility] [address] [place],   [address] Phone - [phone]   Fax - [phone]   

## 2014-12-09 ENCOUNTER — Encounter: Payer: Self-pay | Admitting: Family

## 2014-12-09 ENCOUNTER — Telehealth: Payer: Self-pay | Admitting: Family

## 2014-12-09 DIAGNOSIS — O99019 Anemia complicating pregnancy, unspecified trimester: Secondary | ICD-10-CM | POA: Insufficient documentation

## 2014-12-09 LAB — GLUCOSE TOLERANCE, 1 HOUR (50G) W/O FASTING: Glucose, 1 Hour GTT: 89 mg/dL (ref 70–140)

## 2014-12-09 LAB — RPR

## 2014-12-09 MED ORDER — INTEGRA F 125-1 MG PO CAPS: 1.0000 | ORAL_CAPSULE | Freq: Every day | ORAL | Status: DC

## 2014-12-09 NOTE — Telephone Encounter (Signed)
Pt called, no answer left message on voicemail to call clinic.  When call please inform iron level is low and RX for Arnette Schaumannintegra was sent to pharmacy.

## 2014-12-10 LAB — HIV ANTIBODY (ROUTINE TESTING W REFLEX): HIV 1&2 Ab, 4th Generation: NONREACTIVE

## 2014-12-29 ENCOUNTER — Ambulatory Visit (INDEPENDENT_AMBULATORY_CARE_PROVIDER_SITE_OTHER): Payer: Medicaid Other | Admitting: Family Medicine

## 2014-12-29 VITALS — BP 105/84 | HR 98 | Temp 98.6°F | Wt 118.0 lb

## 2014-12-29 DIAGNOSIS — Z3481 Encounter for supervision of other normal pregnancy, first trimester: Secondary | ICD-10-CM | POA: Diagnosis present

## 2014-12-29 DIAGNOSIS — M9909 Segmental and somatic dysfunction of abdomen and other regions: Secondary | ICD-10-CM

## 2014-12-29 DIAGNOSIS — M549 Dorsalgia, unspecified: Secondary | ICD-10-CM

## 2014-12-29 DIAGNOSIS — O26899 Other specified pregnancy related conditions, unspecified trimester: Secondary | ICD-10-CM

## 2014-12-29 DIAGNOSIS — O9989 Other specified diseases and conditions complicating pregnancy, childbirth and the puerperium: Secondary | ICD-10-CM

## 2014-12-29 DIAGNOSIS — Z3491 Encounter for supervision of normal pregnancy, unspecified, first trimester: Secondary | ICD-10-CM

## 2014-12-29 NOTE — Progress Notes (Signed)
Patient reports lower back and hip pain

## 2014-12-29 NOTE — Patient Instructions (Signed)
Third Trimester of Pregnancy The third trimester is from week 29 through week 42, months 7 through 9. The third trimester is a time when the fetus is growing rapidly. At the end of the ninth month, the fetus is about 20 inches in length and weighs 6-10 pounds.  BODY CHANGES Your body goes through many changes during pregnancy. The changes vary from woman to woman.   Your weight will continue to increase. You can expect to gain 25-35 pounds (11-16 kg) by the end of the pregnancy.  You may begin to get stretch marks on your hips, abdomen, and breasts.  You may urinate more often because the fetus is moving lower into your pelvis and pressing on your bladder.  You may develop or continue to have heartburn as a result of your pregnancy.  You may develop constipation because certain hormones are causing the muscles that push waste through your intestines to slow down.  You may develop hemorrhoids or swollen, bulging veins (varicose veins).  You may have pelvic pain because of the weight gain and pregnancy hormones relaxing your joints between the bones in your pelvis. Backaches may result from overexertion of the muscles supporting your posture.  You may have changes in your hair. These can include thickening of your hair, rapid growth, and changes in texture. Some women also have hair loss during or after pregnancy, or hair that feels dry or thin. Your hair will most likely return to normal after your baby is born.  Your breasts will continue to grow and be tender. A yellow discharge may leak from your breasts called colostrum.  Your belly button may stick out.  You may feel short of breath because of your expanding uterus.  You may notice the fetus "dropping," or moving lower in your abdomen.  You may have a bloody mucus discharge. This usually occurs a few days to a week before labor begins.  Your cervix becomes thin and soft (effaced) near your due date. WHAT TO EXPECT AT YOUR PRENATAL  EXAMS  You will have prenatal exams every 2 weeks until week 36. Then, you will have weekly prenatal exams. During a routine prenatal visit:  You will be weighed to make sure you and the fetus are growing normally.  Your blood pressure is taken.  Your abdomen will be measured to track your baby's growth.  The fetal heartbeat will be listened to.  Any test results from the previous visit will be discussed.  You may have a cervical check near your due date to see if you have effaced. At around 36 weeks, your caregiver will check your cervix. At the same time, your caregiver will also perform a test on the secretions of the vaginal tissue. This test is to determine if a type of bacteria, Group B streptococcus, is present. Your caregiver will explain this further. Your caregiver may ask you:  What your birth plan is.  How you are feeling.  If you are feeling the baby move.  If you have had any abnormal symptoms, such as leaking fluid, bleeding, severe headaches, or abdominal cramping.  If you have any questions. Other tests or screenings that may be performed during your third trimester include:  Blood tests that check for low iron levels (anemia).  Fetal testing to check the health, activity level, and growth of the fetus. Testing is done if you have certain medical conditions or if there are problems during the pregnancy. FALSE LABOR You may feel small, irregular contractions that   eventually go away. These are called Braxton Hicks contractions, or false labor. Contractions may last for hours, days, or even weeks before true labor sets in. If contractions come at regular intervals, intensify, or become painful, it is best to be seen by your caregiver.  SIGNS OF LABOR   Menstrual-like cramps.  Contractions that are 5 minutes apart or less.  Contractions that start on the top of the uterus and spread down to the lower abdomen and back.  A sense of increased pelvic pressure or back  pain.  A watery or bloody mucus discharge that comes from the vagina. If you have any of these signs before the 37th week of pregnancy, call your caregiver right away. You need to go to the hospital to get checked immediately. HOME CARE INSTRUCTIONS   Avoid all smoking, herbs, alcohol, and unprescribed drugs. These chemicals affect the formation and growth of the baby.  Follow your caregiver's instructions regarding medicine use. There are medicines that are either safe or unsafe to take during pregnancy.  Exercise only as directed by your caregiver. Experiencing uterine cramps is a good sign to stop exercising.  Continue to eat regular, healthy meals.  Wear a good support bra for breast tenderness.  Do not use hot tubs, steam rooms, or saunas.  Wear your seat belt at all times when driving.  Avoid raw meat, uncooked cheese, cat litter boxes, and soil used by cats. These carry germs that can cause birth defects in the baby.  Take your prenatal vitamins.  Try taking a stool softener (if your caregiver approves) if you develop constipation. Eat more high-fiber foods, such as fresh vegetables or fruit and whole grains. Drink plenty of fluids to keep your urine clear or pale yellow.  Take warm sitz baths to soothe any pain or discomfort caused by hemorrhoids. Use hemorrhoid cream if your caregiver approves.  If you develop varicose veins, wear support hose. Elevate your feet for 15 minutes, 3-4 times a day. Limit salt in your diet.  Avoid heavy lifting, wear low heal shoes, and practice good posture.  Rest a lot with your legs elevated if you have leg cramps or low back pain.  Visit your dentist if you have not gone during your pregnancy. Use a soft toothbrush to brush your teeth and be gentle when you floss.  A sexual relationship may be continued unless your caregiver directs you otherwise.  Do not travel far distances unless it is absolutely necessary and only with the approval  of your caregiver.  Take prenatal classes to understand, practice, and ask questions about the labor and delivery.  Make a trial run to the hospital.  Pack your hospital bag.  Prepare the baby's nursery.  Continue to go to all your prenatal visits as directed by your caregiver. SEEK MEDICAL CARE IF:  You are unsure if you are in labor or if your water has broken.  You have dizziness.  You have mild pelvic cramps, pelvic pressure, or nagging pain in your abdominal area.  You have persistent nausea, vomiting, or diarrhea.  You have a bad smelling vaginal discharge.  You have pain with urination. SEEK IMMEDIATE MEDICAL CARE IF:   You have a fever.  You are leaking fluid from your vagina.  You have spotting or bleeding from your vagina.  You have severe abdominal cramping or pain.  You have rapid weight loss or gain.  You have shortness of breath with chest pain.  You notice sudden or extreme swelling   of your face, hands, ankles, feet, or legs.  You have not felt your baby move in over an hour.  You have severe headaches that do not go away with medicine.  You have vision changes. Document Released: 05/22/2001 Document Revised: 06/02/2013 Document Reviewed: 07/29/2012 [employer] Patient Information 2015 [employer]. This information is not intended to replace advice given to you by your health care provider. Make sure you discuss any questions you have with your health care provider.  

## 2014-12-29 NOTE — Progress Notes (Signed)
Subjective:  Deanna Warren is a 24 y.o. G1P0 at 6680w2d being seen today for ongoing prenatal care.  Patient reports constant lower left backache.  Worse with activity - bending, walking, laying down for prolonged periods.  Tylenol not helpful. Contractions: Not present.  Vag. Bleeding: None. Movement: Present. Denies leaking of fluid.   The following portions of the patient's history were reviewed and updated as appropriate: allergies, current medications, past family history, past medical history, past social history, past surgical history and problem list.   Objective:   Filed Vitals:   12/29/14 0938  BP: 105/84  Pulse: 98  Temp: 98.6 F (37 C)  Weight: 118 lb (53.524 kg)    Fetal Status: Fetal Heart Rate (bpm): 148   Movement: Present     General:  Alert, oriented and cooperative. Patient is in no acute distress.  Skin: Skin is warm and dry. No rash noted.   Cardiovascular: Normal heart rate noted  Respiratory: Normal respiratory effort, no problems with respiration noted  Abdomen: Soft, gravid, appropriate for gestational age. Pain/Pressure: Present     Vaginal: Vag. Bleeding: None.    Vag D/C Character: White  Cervix: Not evaluated        Extremities: Normal range of motion.  Edema: Trace  MSK: Hypertonic left lumbar paraspinals.  Right inferior ASIS.  Right restricted SI joint  OSE: T12 ESRL, Ribs 10-12 on left inhaled.  L5 ESRL.  L/L sacral torsion.  Right ant innom.  Mental Status: Normal mood and affect. Normal behavior. Normal judgment and thought content.   Urinalysis: Urine Protein: 1+ Urine Glucose: Negative  Assessment and Plan:  Pregnancy: G1P0 at 7080w2d  1. Supervision of normal pregnancy in first trimester FHT normal.  Fundal height normal.  2. Back pain in pregnancy OMT done - HVLA employed with good results.    3. Somatic dysfunction Thoracic, ribs, lumbar, sacrum, pelvis.  Preterm labor symptoms and general obstetric precautions including but not  limited to vaginal bleeding, contractions, leaking of fluid and fetal movement were reviewed in detail with the patient. Please refer to After Visit Summary for other counseling recommendations.  Return in about 2 weeks (around 01/12/2015).   Levie HeritageJacob J Stinson, DO

## 2015-01-12 ENCOUNTER — Telehealth: Payer: Self-pay | Admitting: General Practice

## 2015-01-12 ENCOUNTER — Ambulatory Visit (INDEPENDENT_AMBULATORY_CARE_PROVIDER_SITE_OTHER): Payer: Medicaid Other | Admitting: Family Medicine

## 2015-01-12 VITALS — BP 96/78 | HR 97 | Temp 98.3°F | Wt 119.3 lb

## 2015-01-12 DIAGNOSIS — Z3483 Encounter for supervision of other normal pregnancy, third trimester: Secondary | ICD-10-CM

## 2015-01-12 DIAGNOSIS — Z3491 Encounter for supervision of normal pregnancy, unspecified, first trimester: Secondary | ICD-10-CM

## 2015-01-12 DIAGNOSIS — O99013 Anemia complicating pregnancy, third trimester: Secondary | ICD-10-CM

## 2015-01-12 LAB — POCT URINALYSIS DIP (DEVICE)
BILIRUBIN URINE: NEGATIVE
Glucose, UA: NEGATIVE mg/dL
Hgb urine dipstick: NEGATIVE
KETONES UR: NEGATIVE mg/dL
Nitrite: NEGATIVE
Protein, ur: 30 mg/dL — AB
Specific Gravity, Urine: 1.02 (ref 1.005–1.030)
UROBILINOGEN UA: 2 mg/dL — AB (ref 0.0–1.0)
pH: 8.5 — ABNORMAL HIGH (ref 5.0–8.0)

## 2015-01-12 NOTE — Progress Notes (Signed)
Subjective:  Deanna Warren is a 24 y.o. G1P0 at [redacted]w[redacted]d being seen today for ongoing prenatal care.  Patient reports no complaints.  Contractions: Not present.  Vag. Bleeding: None. Movement: Present. Denies leaking of fluid.   Anemia: Taking Fe- misses two days at the most. Taking daily. Reports compliance with prenatal vitamin.   The following portions of the patient's history were reviewed and updated as appropriate: allergies, current medications, past family history, past medical history, past social history, past surgical history and problem list.   Objective:   Filed Vitals:   01/12/15 1104  BP: 96/78  Pulse: 97  Temp: 98.3 F (36.8 C)  Weight: 119 lb 4.8 oz (54.114 kg)    Fetal Status: Fetal Heart Rate (bpm): 147 Fundal Height: 32 cm Movement: Present  Presentation: Vertex  General:  Alert, oriented and cooperative. Patient is in no acute distress.  Skin: Skin is warm and dry. No rash noted.   Cardiovascular: Normal heart rate noted  Respiratory: Normal respiratory effort, no problems with respiration noted  Abdomen: Soft, gravid, appropriate for gestational age. Pain/Pressure: Present     Vaginal: Vag. Bleeding: None.    Vag D/C Character: White  Cervix: Not evaluated        Extremities: Normal range of motion.  Edema: Trace  Mental Status: Normal mood and affect. Normal behavior. Normal judgment and thought content.   Urinalysis: Urine Protein: 1+ Urine Glucose: Negative  Assessment and Plan:  Pregnancy: G1P0 at [redacted]w[redacted]d  1. Supervision of normal pregnancy in first trimester Updated pregnancy box. Reviewed preterm labor precautions Discussed contraceptive options- currently wants depo. Discussed fertility shadow of depo. Continue to address possible nexplanon vs IUD 2. Anemia in pregnancy, third trimester Lab Results  Component Value Date   HGB 9.4* 12/08/2014  Repeat in 2 weeks, consider increased dose to BID vs TID   Preterm labor symptoms and general  obstetric precautions including but not limited to vaginal bleeding, contractions, leaking of fluid and fetal movement were reviewed in detail with the patient. Please refer to After Visit Summary for other counseling recommendations.  Return in about 2 weeks (around 01/26/2015) for [facility].  Federico Flake, MD

## 2015-01-12 NOTE — Telephone Encounter (Addendum)
Patient left office today before lab draw. Called patient, no answer- left message stating we are trying to reach you because you left today prior to getting your blood drawn, please call the front office to schedule this appt.  8/10  1120  Per note from Dr. Alvester Morin on 8/8, pt can have CBC @ next visit 8/17 for evaluation of anemia status.  Diane Day RNC

## 2015-01-12 NOTE — Patient Instructions (Addendum)
Third Trimester of Pregnancy The third trimester is from week 29 through week 42, months 7 through 9. The third trimester is a time when the fetus is growing rapidly. At the end of the ninth month, the fetus is about 20 inches in length and weighs 6-10 pounds.  BODY CHANGES Your body goes through many changes during pregnancy. The changes vary from woman to woman.   Your weight will continue to increase. You can expect to gain 25-35 pounds (11-16 kg) by the end of the pregnancy.  You may begin to get stretch marks on your hips, abdomen, and breasts.  You may urinate more often because the fetus is moving lower into your pelvis and pressing on your bladder.  You may develop or continue to have heartburn as a result of your pregnancy.  You may develop constipation because certain hormones are causing the muscles that push waste through your intestines to slow down.  You may develop hemorrhoids or swollen, bulging veins (varicose veins).  You may have pelvic pain because of the weight gain and pregnancy hormones relaxing your joints between the bones in your pelvis. Backaches may result from overexertion of the muscles supporting your posture.  You may have changes in your hair. These can include thickening of your hair, rapid growth, and changes in texture. Some women also have hair loss during or after pregnancy, or hair that feels dry or thin. Your hair will most likely return to normal after your baby is born.  Your breasts will continue to grow and be tender. A yellow discharge may leak from your breasts called colostrum.  Your belly button may stick out.  You may feel short of breath because of your expanding uterus.  You may notice the fetus "dropping," or moving lower in your abdomen.  You may have a bloody mucus discharge. This usually occurs a few days to a week before labor begins.  Your cervix becomes thin and soft (effaced) near your due date. WHAT TO EXPECT AT YOUR PRENATAL  EXAMS  You will have prenatal exams every 2 weeks until week 36. Then, you will have weekly prenatal exams. During a routine prenatal visit:  You will be weighed to make sure you and the fetus are growing normally.  Your blood pressure is taken.  Your abdomen will be measured to track your baby's growth.  The fetal heartbeat will be listened to.  Any test results from the previous visit will be discussed.  You may have a cervical check near your due date to see if you have effaced. At around 36 weeks, your caregiver will check your cervix. At the same time, your caregiver will also perform a test on the secretions of the vaginal tissue. This test is to determine if a type of bacteria, Group B streptococcus, is present. Your caregiver will explain this further. Your caregiver may ask you:  What your birth plan is.  How you are feeling.  If you are feeling the baby move.  If you have had any abnormal symptoms, such as leaking fluid, bleeding, severe headaches, or abdominal cramping.  If you have any questions. Other tests or screenings that may be performed during your third trimester include:  Blood tests that check for low iron levels (anemia).  Fetal testing to check the health, activity level, and growth of the fetus. Testing is done if you have certain medical conditions or if there are problems during the pregnancy. FALSE LABOR You may feel small, irregular contractions that   eventually go away. These are called Braxton Hicks contractions, or false labor. Contractions may last for hours, days, or even weeks before true labor sets in. If contractions come at regular intervals, intensify, or become painful, it is best to be seen by your caregiver.  SIGNS OF LABOR   Menstrual-like cramps.  Contractions that are 5 minutes apart or less.  Contractions that start on the top of the uterus and spread down to the lower abdomen and back.  A sense of increased pelvic pressure or back  pain.  A watery or bloody mucus discharge that comes from the vagina. If you have any of these signs before the 37th week of pregnancy, call your caregiver right away. You need to go to the hospital to get checked immediately. HOME CARE INSTRUCTIONS   Avoid all smoking, herbs, alcohol, and unprescribed drugs. These chemicals affect the formation and growth of the baby.  Follow your caregiver's instructions regarding medicine use. There are medicines that are either safe or unsafe to take during pregnancy.  Exercise only as directed by your caregiver. Experiencing uterine cramps is a good sign to stop exercising.  Continue to eat regular, healthy meals.  Wear a good support bra for breast tenderness.  Do not use hot tubs, steam rooms, or saunas.  Wear your seat belt at all times when driving.  Avoid raw meat, uncooked cheese, cat litter boxes, and soil used by cats. These carry germs that can cause birth defects in the baby.  Take your prenatal vitamins.  Try taking a stool softener (if your caregiver approves) if you develop constipation. Eat more high-fiber foods, such as fresh vegetables or fruit and whole grains. Drink plenty of fluids to keep your urine clear or pale yellow.  Take warm sitz baths to soothe any pain or discomfort caused by hemorrhoids. Use hemorrhoid cream if your caregiver approves.  If you develop varicose veins, wear support hose. Elevate your feet for 15 minutes, 3-4 times a day. Limit salt in your diet.  Avoid heavy lifting, wear low heal shoes, and practice good posture.  Rest a lot with your legs elevated if you have leg cramps or low back pain.  Visit your dentist if you have not gone during your pregnancy. Use a soft toothbrush to brush your teeth and be gentle when you floss.  A sexual relationship may be continued unless your caregiver directs you otherwise.  Do not travel far distances unless it is absolutely necessary and only with the approval  of your caregiver.  Take prenatal classes to understand, practice, and ask questions about the labor and delivery.  Make a trial run to the hospital.  Pack your hospital bag.  Prepare the baby's nursery.  Continue to go to all your prenatal visits as directed by your caregiver. SEEK MEDICAL CARE IF:  You are unsure if you are in labor or if your water has broken.  You have dizziness.  You have mild pelvic cramps, pelvic pressure, or nagging pain in your abdominal area.  You have persistent nausea, vomiting, or diarrhea.  You have a bad smelling vaginal discharge.  You have pain with urination. SEEK IMMEDIATE MEDICAL CARE IF:   You have a fever.  You are leaking fluid from your vagina.  You have spotting or bleeding from your vagina.  You have severe abdominal cramping or pain.  You have rapid weight loss or gain.  You have shortness of breath with chest pain.  You notice sudden or extreme swelling   of your face, hands, ankles, feet, or legs.  You have not felt your baby move in over an hour.  You have severe headaches that do not go away with medicine.  You have vision changes. Document Released: 05/22/2001 Document Revised: 06/02/2013 Document Reviewed: 07/29/2012 [employer] Patient Information 2015 [employer]. This information is not intended to replace advice given to you by your health care provider. Make sure you discuss any questions you have with your health care provider.  

## 2015-01-12 NOTE — Progress Notes (Signed)
Reviewed tip of week with patient  

## 2015-01-26 ENCOUNTER — Encounter: Payer: Medicaid Other | Admitting: Advanced Practice Midwife

## 2015-02-01 ENCOUNTER — Ambulatory Visit (INDEPENDENT_AMBULATORY_CARE_PROVIDER_SITE_OTHER): Payer: Medicaid Other | Admitting: Family Medicine

## 2015-02-01 VITALS — BP 110/48 | HR 91 | Temp 98.4°F | Wt 124.3 lb

## 2015-02-01 DIAGNOSIS — Z3483 Encounter for supervision of other normal pregnancy, third trimester: Secondary | ICD-10-CM

## 2015-02-01 DIAGNOSIS — O99013 Anemia complicating pregnancy, third trimester: Secondary | ICD-10-CM | POA: Diagnosis present

## 2015-02-01 DIAGNOSIS — Z3491 Encounter for supervision of normal pregnancy, unspecified, first trimester: Secondary | ICD-10-CM

## 2015-02-01 LAB — POCT URINALYSIS DIP (DEVICE)
BILIRUBIN URINE: NEGATIVE
Glucose, UA: 100 mg/dL — AB
Ketones, ur: NEGATIVE mg/dL
LEUKOCYTES UA: NEGATIVE
NITRITE: NEGATIVE
PH: 6 (ref 5.0–8.0)
Protein, ur: NEGATIVE mg/dL
Specific Gravity, Urine: 1.025 (ref 1.005–1.030)
Urobilinogen, UA: 1 mg/dL (ref 0.0–1.0)

## 2015-02-01 LAB — OB RESULTS CONSOLE GBS: STREP GROUP B AG: NEGATIVE

## 2015-02-01 NOTE — Patient Instructions (Signed)
Breastfeeding Deciding to breastfeed is one of the best choices you can make for you and your baby. A change in hormones during pregnancy causes your breast tissue to grow and increases the number and size of your milk ducts. These hormones also allow proteins, sugars, and fats from your blood supply to make breast milk in your milk-producing glands. Hormones prevent breast milk from being released before your baby is born as well as prompt milk flow after birth. Once breastfeeding has begun, thoughts of your baby, as well as his or her sucking or crying, can stimulate the release of milk from your milk-producing glands.  BENEFITS OF BREASTFEEDING For Your Baby  Your first milk (colostrum) helps your baby's digestive system function better.   There are antibodies in your milk that help your baby fight off infections.   Your baby has a lower incidence of asthma, allergies, and sudden infant death syndrome.   The nutrients in breast milk are better for your baby than infant formulas and are designed uniquely for your baby's needs.   Breast milk improves your baby's brain development.   Your baby is less likely to develop other conditions, such as childhood obesity, asthma, or type 2 diabetes mellitus.  For You   Breastfeeding helps to create a very special bond between you and your baby.   Breastfeeding is convenient. Breast milk is always available at the correct temperature and costs nothing.   Breastfeeding helps to burn calories and helps you lose the weight gained during pregnancy.   Breastfeeding makes your uterus contract to its prepregnancy size faster and slows bleeding (lochia) after you give birth.   Breastfeeding helps to lower your risk of developing type 2 diabetes mellitus, osteoporosis, and breast or ovarian cancer later in life. SIGNS THAT YOUR BABY IS HUNGRY Early Signs of Hunger  Increased alertness or activity.  Stretching.  Movement of the head from  side to side.  Movement of the head and opening of the mouth when the corner of the mouth or cheek is stroked (rooting).  Increased sucking sounds, smacking lips, cooing, sighing, or squeaking.  Hand-to-mouth movements.  Increased sucking of fingers or hands. Late Signs of Hunger  Fussing.  Intermittent crying. Extreme Signs of Hunger Signs of extreme hunger will require calming and consoling before your baby will be able to breastfeed successfully. Do not wait for the following signs of extreme hunger to occur before you initiate breastfeeding:   Restlessness.  A loud, strong cry.   Screaming. BREASTFEEDING BASICS Breastfeeding Initiation  Find a comfortable place to sit or lie down, with your neck and back well supported.  Place a pillow or rolled up blanket under your baby to bring him or her to the level of your breast (if you are seated). Nursing pillows are specially designed to help support your arms and your baby while you breastfeed.  Make sure that your baby's abdomen is facing your abdomen.   Gently massage your breast. With your fingertips, massage from your chest wall toward your nipple in a circular motion. This encourages milk flow. You may need to continue this action during the feeding if your milk flows slowly.  Support your breast with 4 fingers underneath and your thumb above your nipple. Make sure your fingers are well away from your nipple and your baby's mouth.   Stroke your baby's lips gently with your finger or nipple.   When your baby's mouth is open wide enough, quickly bring your baby to your   breast, placing your entire nipple and as much of the colored area around your nipple (areola) as possible into your baby's mouth.   More areola should be visible above your baby's upper lip than below the lower lip.   Your baby's tongue should be between his or her lower gum and your breast.   Ensure that your baby's mouth is correctly positioned  around your nipple (latched). Your baby's lips should create a seal on your breast and be turned out (everted).  It is common for your baby to suck about 2-3 minutes in order to start the flow of breast milk. Latching Teaching your baby how to latch on to your breast properly is very important. An improper latch can cause nipple pain and decreased milk supply for you and poor weight gain in your baby. Also, if your baby is not latched onto your nipple properly, he or she may swallow some air during feeding. This can make your baby fussy. Burping your baby when you switch breasts during the feeding can help to get rid of the air. However, teaching your baby to latch on properly is still the best way to prevent fussiness from swallowing air while breastfeeding. Signs that your baby has successfully latched on to your nipple:    Silent tugging or silent sucking, without causing you pain.   Swallowing heard between every 3-4 sucks.    Muscle movement above and in front of his or her ears while sucking.  Signs that your baby has not successfully latched on to nipple:   Sucking sounds or smacking sounds from your baby while breastfeeding.  Nipple pain. If you think your baby has not latched on correctly, slip your finger into the corner of your baby's mouth to break the suction and place it between your baby's gums. Attempt breastfeeding initiation again. Signs of Successful Breastfeeding Signs from your baby:   A gradual decrease in the number of sucks or complete cessation of sucking.   Falling asleep.   Relaxation of his or her body.   Retention of a small amount of milk in his or her mouth.   Letting go of your breast by himself or herself. Signs from you:  Breasts that have increased in firmness, weight, and size 1-3 hours after feeding.   Breasts that are softer immediately after breastfeeding.  Increased milk volume, as well as a change in milk consistency and color by  the fifth day of breastfeeding.   Nipples that are not sore, cracked, or bleeding. Signs That Your Baby is Getting Enough Milk  Wetting at least 3 diapers in a 24-hour period. The urine should be clear and pale yellow by age 5 days.  At least 3 stools in a 24-hour period by age 5 days. The stool should be soft and yellow.  At least 3 stools in a 24-hour period by age 7 days. The stool should be seedy and yellow.  No loss of weight greater than 10% of birth weight during the first 3 days of age.  Average weight gain of 4-7 ounces (113-198 g) per week after age 4 days.  Consistent daily weight gain by age 5 days, without weight loss after the age of 2 weeks. After a feeding, your baby may spit up a small amount. This is common. BREASTFEEDING FREQUENCY AND DURATION Frequent feeding will help you make more milk and can prevent sore nipples and breast engorgement. Breastfeed when you feel the need to reduce the fullness of your breasts   or when your baby shows signs of hunger. This is called "breastfeeding on demand." Avoid introducing a pacifier to your baby while you are working to establish breastfeeding (the first 4-6 weeks after your baby is born). After this time you may choose to use a pacifier. Research has shown that pacifier use during the first year of a baby's life decreases the risk of sudden infant death syndrome (SIDS). Allow your baby to feed on each breast as long as he or she wants. Breastfeed until your baby is finished feeding. When your baby unlatches or falls asleep while feeding from the first breast, offer the second breast. Because newborns are often sleepy in the first few weeks of life, you may need to awaken your baby to get him or her to feed. Breastfeeding times will vary from baby to baby. However, the following rules can serve as a guide to help you ensure that your baby is properly fed:  Newborns (babies 4 weeks of age or younger) may breastfeed every 1-3  hours.  Newborns should not go longer than 3 hours during the day or 5 hours during the night without breastfeeding.  You should breastfeed your baby a minimum of 8 times in a 24-hour period until you begin to introduce solid foods to your baby at around 6 months of age. BREAST MILK PUMPING Pumping and storing breast milk allows you to ensure that your baby is exclusively fed your breast milk, even at times when you are unable to breastfeed. This is especially important if you are going back to work while you are still breastfeeding or when you are not able to be present during feedings. Your lactation consultant can give you guidelines on how long it is safe to store breast milk.  A breast pump is a machine that allows you to pump milk from your breast into a sterile bottle. The pumped breast milk can then be stored in a refrigerator or freezer. Some breast pumps are operated by hand, while others use electricity. Ask your lactation consultant which type will work best for you. Breast pumps can be purchased, but some hospitals and breastfeeding support groups lease breast pumps on a monthly basis. A lactation consultant can teach you how to hand express breast milk, if you prefer not to use a pump.  CARING FOR YOUR BREASTS WHILE YOU BREASTFEED Nipples can become dry, cracked, and sore while breastfeeding. The following recommendations can help keep your breasts moisturized and healthy:  Avoid using soap on your nipples.   Wear a supportive bra. Although not required, special nursing bras and tank tops are designed to allow access to your breasts for breastfeeding without taking off your entire bra or top. Avoid wearing underwire-style bras or extremely tight bras.  Air dry your nipples for 3-4minutes after each feeding.   Use only cotton bra pads to absorb leaked breast milk. Leaking of breast milk between feedings is normal.   Use lanolin on your nipples after breastfeeding. Lanolin helps to  maintain your skin's normal moisture barrier. If you use pure lanolin, you do not need to wash it off before feeding your baby again. Pure lanolin is not toxic to your baby. You may also hand express a few drops of breast milk and gently massage that milk into your nipples and allow the milk to air dry. In the first few weeks after giving birth, some women experience extremely full breasts (engorgement). Engorgement can make your breasts feel heavy, warm, and tender to the   touch. Engorgement peaks within 3-5 days after you give birth. The following recommendations can help ease engorgement:  Completely empty your breasts while breastfeeding or pumping. You may want to start by applying warm, moist heat (in the shower or with warm water-soaked hand towels) just before feeding or pumping. This increases circulation and helps the milk flow. If your baby does not completely empty your breasts while breastfeeding, pump any extra milk after he or she is finished.  Wear a snug bra (nursing or regular) or tank top for 1-2 days to signal your body to slightly decrease milk production.  Apply ice packs to your breasts, unless this is too uncomfortable for you.  Make sure that your baby is latched on and positioned properly while breastfeeding. If engorgement persists after 48 hours of following these recommendations, contact your health care provider or a lactation consultant. OVERALL HEALTH CARE RECOMMENDATIONS WHILE BREASTFEEDING  Eat healthy foods. Alternate between meals and snacks, eating 3 of each per day. Because what you eat affects your breast milk, some of the foods may make your baby more irritable than usual. Avoid eating these foods if you are sure that they are negatively affecting your baby.  Drink milk, fruit juice, and water to satisfy your thirst (about 10 glasses a day).   Rest often, relax, and continue to take your prenatal vitamins to prevent fatigue, stress, and anemia.  Continue  breast self-awareness checks.  Avoid chewing and smoking tobacco.  Avoid alcohol and drug use. Some medicines that may be harmful to your baby can pass through breast milk. It is important to ask your health care provider before taking any medicine, including all over-the-counter and prescription medicine as well as vitamin and herbal supplements. It is possible to become pregnant while breastfeeding. If birth control is desired, ask your health care provider about options that will be safe for your baby. SEEK MEDICAL CARE IF:   You feel like you want to stop breastfeeding or have become frustrated with breastfeeding.  You have painful breasts or nipples.  Your nipples are cracked or bleeding.  Your breasts are red, tender, or warm.  You have a swollen area on either breast.  You have a fever or chills.  You have nausea or vomiting.  You have drainage other than breast milk from your nipples.  Your breasts do not become full before feedings by the fifth day after you give birth.  You feel sad and depressed.  Your baby is too sleepy to eat well.  Your baby is having trouble sleeping.   Your baby is wetting less than 3 diapers in a 24-hour period.  Your baby has less than 3 stools in a 24-hour period.  Your baby's skin or the white part of his or her eyes becomes yellow.   Your baby is not gaining weight by 5 days of age. SEEK IMMEDIATE MEDICAL CARE IF:   Your baby is overly tired (lethargic) and does not want to wake up and feed.  Your baby develops an unexplained fever. Document Released: 05/28/2005 Document Revised: 06/02/2013 Document Reviewed: 11/19/2012 ExitCare Patient Information 2015 [employer]. This information is not intended to replace advice given to you by your health care provider. Make sure you discuss any questions you have with your health care provider. Natural Childbirth Natural childbirth is going through labor and delivery without any drugs  to relieve pain. You also do not use fetal monitors, have a cesarean delivery, or get a sugical cut to enlarge   the vaginal opening (episiotomy). With the help of a birthing professional (midwife), you will direct your own labor and delivery as you choose. Many women chose natural childbirth because they feel more in control and in touch with their labor and delivery. They are also concerned about the medications affecting themselves and the baby. Pregnant women with a high risk pregnancy should not attempt natural childbirth. It is better to deliver the infant in a hospital if an emergency situation arises. Sometimes, the caregiver has to intervene for the health and safety of the mother and infant. TWO TECHNIQUES FOR NATURAL CHILDBIRTH:   The Lamaze method. This method teaches women that having a baby is normal, healthy, and natural. It also teaches the mother to take a neutral position regarding pain medication and anesthesia and to make an informed decision if and when it is right for them.  The Bradley method (also called husband coached birth). This method teaches the father to be the birth coach and stresses a natural approach. It also encourages exercise and a balanced diet with good nutrition. The exercises teach relaxation and deep breathing techniques. However, there are also classes to prepare the parents for an emergency situation that may occur. METHODS OF DEALING WITH LABOR PAIN AND DELIVERY:  Meditation.  Yoga.  Hypnosis.  Acupuncture.  Massage.  Changing positions (walking, rocking, showering, leaning on birth balls).  Lying in warm water or a jacuzzi.  Find an activity that keeps your mind off of the labor pain.  Listen to soft music.  Visual imagery (focus on a particular object). BEFORE GOING INTO LABOR  Be sure you and your spouse/partner are in agreement to have natural childbirth.  Decide if your caregiver or a midwife will deliver your baby.  Decide if you  will have your baby in the hospital, birthing center, or at home.  If you have children, make plans to have someone to take care of them when you go to the hospital.  Know the distance and the time it takes to go to the delivery center. Make a dry run to be sure.  Have a bag packed with a night gown, bathrobe, and toiletries ready to take when you go into labor.  Keep phone numbers of your family and friends handy if you need to call someone when you go into labor.  Your spouse or partner should go to all the teaching classes.  Talk with your caregiver about the possibility of a medical emergency and what will happen if that occurs. ADVANTAGES OF NATURAL CHILDBIRTH  You are in control of your labor and delivery.  It is safe.  There are no medications or anesthetics that may affect you and the fetus.  There are no invasive procedures such as an episiotomy.  You and your partner will work together, which can increase your bond.  Meditation, yoga, massage, and breathing exercises can be learned while pregnant and help you when you are in labor and at delivery.  In most delivery centers, the family and friends can be involved in the labor and delivery process. DISADVANTAGES OF NATURAL CHILDBIRTH  You will experience pain during your labor and delivery.  The methods of helping relieve your labor pains may not work for you.  You may feel embarrassed, disappointed, and like a failure if you decide to change your mind during labor and not have natural childbirth. AFTER THE DELIVERY  You will be very tired.  You will be uncomfortable because of your uterus contracting. You   will feel soreness around the vagina.  You may feel cold and shaky.This is a natural reaction.  You will be excited, overwhelmed, accomplished, and proud to be a mother. HOME CARE INSTRUCTIONS   Follow the advice and instructions of your caregiver.  Follow the instructions of your natural childbirth  instructor (Lamaze or Bradley Method). Document Released: 05/10/2008 Document Revised: 08/20/2011 Document Reviewed: 02/02/2013 ExitCare Patient Information 2015 [employer]. This information is not intended to replace advice given to you by your health care provider. Make sure you discuss any questions you have with your health care provider.  

## 2015-02-01 NOTE — Progress Notes (Signed)
Subjective:  Deanna Warren is a 24 y.o. G1P0 at [redacted]w[redacted]d being seen today for ongoing prenatal care.  Patient reports Edema/pelvic pressure.  Contractions: Irritability.  Vag. Bleeding: None. Movement: Present. Denies leaking of fluid.   The following portions of the patient's history were reviewed and updated as appropriate: allergies, current medications, past family history, past medical history, past social history, past surgical history and problem list.   Objective:   Filed Vitals:   02/01/15 1430  BP: 110/48  Pulse: 91  Temp: 98.4 F (36.9 C)  Weight: 124 lb 4.8 oz (56.382 kg)    Fetal Status: Fetal Heart Rate (bpm): 146   Movement: Present     General:  Alert, oriented and cooperative. Patient is in no acute distress.  Skin: Skin is warm and dry. No rash noted.   Cardiovascular: Normal heart rate noted  Respiratory: Normal respiratory effort, no problems with respiration noted  Abdomen: Soft, gravid, appropriate for gestational age. Pain/Pressure: Present     Pelvic: Vag. Bleeding: None Vag D/C Character: White   Cervical exam deferred        Extremities: Normal range of motion.  Edema: Trace  Mental Status: Normal mood and affect. Normal behavior. Normal judgment and thought content.   Urinalysis: Urine Protein: Negative Urine Glucose: 1+  Assessment and Plan:  Pregnancy: G1P0 at [redacted]w[redacted]d  1. Anemia in pregnancy, third trimester- HGB 9.4 -continue integra -CBC  2. Supervision of normal pregnancy in first trimester - Culture, beta strep (group b only) - GC/Chlamydia probe amp ([place])not at [facility]  Term labor symptoms and general obstetric precautions including but not limited to vaginal bleeding, contractions, leaking of fluid and fetal movement were reviewed in detail with the patient. Please refer to After Visit Summary for other counseling recommendations.   Return in about 1 week (around 02/08/2015) for Routine prenatal care.   Federico Flake,  MD

## 2015-02-01 NOTE — Progress Notes (Signed)
Edema- hands/feet   Pain/pressure- pelvic

## 2015-02-03 LAB — CULTURE, BETA STREP (GROUP B ONLY)

## 2015-02-09 ENCOUNTER — Ambulatory Visit (INDEPENDENT_AMBULATORY_CARE_PROVIDER_SITE_OTHER): Payer: Medicaid Other | Admitting: Physician Assistant

## 2015-02-09 ENCOUNTER — Other Ambulatory Visit (HOSPITAL_COMMUNITY)
Admission: RE | Admit: 2015-02-09 | Discharge: 2015-02-09 | Disposition: A | Discharge status: Home / Self Care | Payer: 59 | Source: Ambulatory Visit | Attending: Physician Assistant | Admitting: Physician Assistant

## 2015-02-09 VITALS — BP 107/66 | HR 93 | Temp 98.5°F | Wt 124.6 lb

## 2015-02-09 DIAGNOSIS — Z113 Encounter for screening for infections with a predominantly sexual mode of transmission: Secondary | ICD-10-CM | POA: Diagnosis not present

## 2015-02-09 DIAGNOSIS — Z3491 Encounter for supervision of normal pregnancy, unspecified, first trimester: Secondary | ICD-10-CM

## 2015-02-09 DIAGNOSIS — O99013 Anemia complicating pregnancy, third trimester: Secondary | ICD-10-CM | POA: Diagnosis not present

## 2015-02-09 DIAGNOSIS — Z118 Encounter for screening for other infectious and parasitic diseases: Secondary | ICD-10-CM | POA: Diagnosis not present

## 2015-02-09 DIAGNOSIS — Z3481 Encounter for supervision of other normal pregnancy, first trimester: Secondary | ICD-10-CM

## 2015-02-09 LAB — POCT URINALYSIS DIP (DEVICE)
BILIRUBIN URINE: NEGATIVE
Glucose, UA: NEGATIVE mg/dL
HGB URINE DIPSTICK: NEGATIVE
KETONES UR: NEGATIVE mg/dL
Nitrite: NEGATIVE
PH: 6 (ref 5.0–8.0)
Protein, ur: NEGATIVE mg/dL
SPECIFIC GRAVITY, URINE: 1.02 (ref 1.005–1.030)
Urobilinogen, UA: 1 mg/dL (ref 0.0–1.0)

## 2015-02-09 LAB — CBC
HEMATOCRIT: 30.4 % — AB (ref 36.0–46.0)
Hemoglobin: 10.1 g/dL — ABNORMAL LOW (ref 12.0–15.0)
MCH: 26.7 pg (ref 26.0–34.0)
MCHC: 33.2 g/dL (ref 30.0–36.0)
MCV: 80.4 fL (ref 78.0–100.0)
MPV: 10.5 fL (ref 8.6–12.4)
Platelets: 198 10*3/uL (ref 150–400)
RBC: 3.78 MIL/uL — AB (ref 3.87–5.11)
RDW: 15.8 % — ABNORMAL HIGH (ref 11.5–15.5)
WBC: 6.5 10*3/uL (ref 4.0–10.5)

## 2015-02-09 NOTE — Progress Notes (Signed)
Subjective:  Deanna Warren is a 24 y.o. G1P0 at [redacted]w[redacted]d being seen today for ongoing prenatal care.  Patient reports occasional contractions.  Contractions: Irritability.  Vag. Bleeding: None. Movement: Present. Denies leaking of fluid.   The following portions of the patient's history were reviewed and updated as appropriate: allergies, current medications, past family history, past medical history, past social history, past surgical history and problem list.   Objective:   Filed Vitals:   02/09/15 1146  BP: 107/66  Pulse: 93  Temp: 98.5 F (36.9 C)  Weight: 124 lb 9.6 oz (56.518 kg)    Fetal Status: Fetal Heart Rate (bpm): 148 Fundal Height: 36 cm Movement: Present     General:  Alert, oriented and cooperative. Patient is in no acute distress.  Skin: Skin is warm and dry. No rash noted.   Cardiovascular: Normal heart rate noted  Respiratory: Normal respiratory effort, no problems with respiration noted  Abdomen: Soft, gravid, appropriate for gestational age. Pain/Pressure: Present     Pelvic: Vag. Bleeding: None Vag D/C Character: White   Cervical exam deferred      Pt refused cervical exam at this time  Extremities: Normal range of motion.  Edema: Trace  Mental Status: Normal mood and affect. Normal behavior. Normal judgment and thought content.   Urinalysis: Urine Protein: Negative Urine Glucose: Negative  Assessment and Plan:  Pregnancy: G1P0 at [redacted]w[redacted]d   1. Supervision of normal pregnancy in first trimester   2. Anemia in pregnancy, third trimester   -GC/Chlamydia collected  -CBC  Term labor symptoms and general obstetric precautions including but not limited to vaginal bleeding, contractions, leaking of fluid and fetal movement were reviewed in detail with the patient. Please refer to After Visit Summary for other counseling recommendations.  Return in about 1 week (around 02/16/2015) for obf.   Judeth Horn, NP

## 2015-02-09 NOTE — Patient Instructions (Signed)
Braxton Hicks Contractions °Contractions of the uterus can occur throughout pregnancy. Contractions are not always a sign that you are in labor.  °WHAT ARE BRAXTON HICKS CONTRACTIONS?  °Contractions that occur before labor are called Braxton Hicks contractions, or false labor. Toward the end of pregnancy (32-34 weeks), these contractions can develop more often and may become more forceful. This is not true labor because these contractions do not result in opening (dilatation) and thinning of the cervix. They are sometimes difficult to tell apart from true labor because these contractions can be forceful and people have different pain tolerances. You should not feel embarrassed if you go to the hospital with false labor. Sometimes, the only way to tell if you are in true labor is for your health care provider to look for changes in the cervix. °If there are no prenatal problems or other health problems associated with the pregnancy, it is completely safe to be sent home with false labor and await the onset of true labor. °HOW CAN YOU TELL THE DIFFERENCE BETWEEN TRUE AND FALSE LABOR? °False Labor °· The contractions of false labor are usually shorter and not as hard as those of true labor.   °· The contractions are usually irregular.   °· The contractions are often felt in the front of the lower abdomen and in the groin.   °· The contractions may go away when you walk around or change positions while lying down.   °· The contractions get weaker and are shorter lasting as time goes on.   °· The contractions do not usually become progressively stronger, regular, and closer together as with true labor.   °True Labor °1. Contractions in true labor last 30-70 seconds, become very regular, usually become more intense, and increase in frequency.   °2. The contractions do not go away with walking.   °3. The discomfort is usually felt in the top of the uterus and spreads to the lower abdomen and low back.   °4. True labor can  be determined by your health care provider with an exam. This will show that the cervix is dilating and getting thinner.   °WHAT TO REMEMBER °· Keep up with your usual exercises and follow other instructions given by your health care provider.   °· Take medicines as directed by your health care provider.   °· Keep your regular prenatal appointments.   °· Eat and drink lightly if you think you are going into labor.   °· If Braxton Hicks contractions are making you uncomfortable:   °· Change your position from lying down or resting to walking, or from walking to resting.   °· Sit and rest in a tub of warm water.   °· Drink 2-3 glasses of water. Dehydration may cause these contractions.   °· Do slow and deep breathing several times an hour.   °WHEN SHOULD I SEEK IMMEDIATE MEDICAL CARE? °Seek immediate medical care if: °· Your contractions become stronger, more regular, and closer together.   °· You have fluid leaking or gushing from your vagina.   °· You have a fever.   °· You pass blood-tinged mucus.   °· You have vaginal bleeding.   °· You have continuous abdominal pain.   °· You have low back pain that you never had before.   °· You feel your baby's head pushing down and causing pelvic pressure.   °· Your baby is not moving as much as it used to.   °Document Released: 05/28/2005 Document Revised: 06/02/2013 Document Reviewed: 03/09/2013 °[employer]® Patient Information ©2015 [employer]. This information is not intended to replace advice given to you by your health care   provider. Make sure you discuss any questions you have with your health care provider. ° °Fetal Movement Counts °Patient Name: __________________________________________________ Patient Due Date: ____________________ °Performing a fetal movement count is highly recommended in high-risk pregnancies, but it is good for every pregnant woman to do. Your health care provider may ask you to start counting fetal movements at 28 weeks of the pregnancy. Fetal  movements often increase: °· After eating a full meal. °· After physical activity. °· After eating or drinking something sweet or cold. °· At rest. °Pay attention to when you feel the baby is most active. This will help you notice a pattern of your baby's sleep and wake cycles and what factors contribute to an increase in fetal movement. It is important to perform a fetal movement count at the same time each day when your baby is normally most active.  °HOW TO COUNT FETAL MOVEMENTS °5. Find a quiet and comfortable area to sit or lie down on your left side. Lying on your left side provides the best blood and oxygen circulation to your baby. °6. Write down the day and time on a sheet of paper or in a journal. °7. Start counting kicks, flutters, swishes, rolls, or jabs in a 2-hour period. You should feel at least 10 movements within 2 hours. °8. If you do not feel 10 movements in 2 hours, wait 2-3 hours and count again. Look for a change in the pattern or not enough counts in 2 hours. °SEEK MEDICAL CARE IF: °· You feel less than 10 counts in 2 hours, tried twice. °· There is no movement in over an hour. °· The pattern is changing or taking longer each day to reach 10 counts in 2 hours. °· You feel the baby is not moving as he or she usually does. °Date: ____________ Movements: ____________ Start time: ____________ Finish time: ____________  °Date: ____________ Movements: ____________ Start time: ____________ Finish time: ____________ °Date: ____________ Movements: ____________ Start time: ____________ Finish time: ____________ °Date: ____________ Movements: ____________ Start time: ____________ Finish time: ____________ °Date: ____________ Movements: ____________ Start time: ____________ Finish time: ____________ °Date: ____________ Movements: ____________ Start time: ____________ Finish time: ____________ °Date: ____________ Movements: ____________ Start time: ____________ Finish time: ____________ °Date: ____________  Movements: ____________ Start time: ____________ Finish time: ____________  °Date: ____________ Movements: ____________ Start time: ____________ Finish time: ____________ °Date: ____________ Movements: ____________ Start time: ____________ Finish time: ____________ °Date: ____________ Movements: ____________ Start time: ____________ Finish time: ____________ °Date: ____________ Movements: ____________ Start time: ____________ Finish time: ____________ °Date: ____________ Movements: ____________ Start time: ____________ Finish time: ____________ °Date: ____________ Movements: ____________ Start time: ____________ Finish time: ____________ °Date: ____________ Movements: ____________ Start time: ____________ Finish time: ____________  °Date: ____________ Movements: ____________ Start time: ____________ Finish time: ____________ °Date: ____________ Movements: ____________ Start time: ____________ Finish time: ____________ °Date: ____________ Movements: ____________ Start time: ____________ Finish time: ____________ °Date: ____________ Movements: ____________ Start time: ____________ Finish time: ____________ °Date: ____________ Movements: ____________ Start time: ____________ Finish time: ____________ °Date: ____________ Movements: ____________ Start time: ____________ Finish time: ____________ °Date: ____________ Movements: ____________ Start time: ____________ Finish time: ____________  °Date: ____________ Movements: ____________ Start time: ____________ Finish time: ____________ °Date: ____________ Movements: ____________ Start time: ____________ Finish time: ____________ °Date: ____________ Movements: ____________ Start time: ____________ Finish time: ____________ °Date: ____________ Movements: ____________ Start time: ____________ Finish time: ____________ °Date: ____________ Movements: ____________ Start time: ____________ Finish time: ____________ °Date: ____________ Movements: ____________ Start time:  ____________ Finish time: ____________ °Date: ____________ Movements:   ____________ Start time: ____________ Finish time: ____________  °Date: ____________ Movements: ____________ Start time: ____________ Finish time: ____________ °Date: ____________ Movements: ____________ Start time: ____________ Finish time: ____________ °Date: ____________ Movements: ____________ Start time: ____________ Finish time: ____________ °Date: ____________ Movements: ____________ Start time: ____________ Finish time: ____________ °Date: ____________ Movements: ____________ Start time: ____________ Finish time: ____________ °Date: ____________ Movements: ____________ Start time: ____________ Finish time: ____________ °Date: ____________ Movements: ____________ Start time: ____________ Finish time: ____________  °Date: ____________ Movements: ____________ Start time: ____________ Finish time: ____________ °Date: ____________ Movements: ____________ Start time: ____________ Finish time: ____________ °Date: ____________ Movements: ____________ Start time: ____________ Finish time: ____________ °Date: ____________ Movements: ____________ Start time: ____________ Finish time: ____________ °Date: ____________ Movements: ____________ Start time: ____________ Finish time: ____________ °Date: ____________ Movements: ____________ Start time: ____________ Finish time: ____________ °Date: ____________ Movements: ____________ Start time: ____________ Finish time: ____________  °Date: ____________ Movements: ____________ Start time: ____________ Finish time: ____________ °Date: ____________ Movements: ____________ Start time: ____________ Finish time: ____________ °Date: ____________ Movements: ____________ Start time: ____________ Finish time: ____________ °Date: ____________ Movements: ____________ Start time: ____________ Finish time: ____________ °Date: ____________ Movements: ____________ Start time: ____________ Finish time: ____________ °Date:  ____________ Movements: ____________ Start time: ____________ Finish time: ____________ °Date: ____________ Movements: ____________ Start time: ____________ Finish time: ____________  °Date: ____________ Movements: ____________ Start time: ____________ Finish time: ____________ °Date: ____________ Movements: ____________ Start time: ____________ Finish time: ____________ °Date: ____________ Movements: ____________ Start time: ____________ Finish time: ____________ °Date: ____________ Movements: ____________ Start time: ____________ Finish time: ____________ °Date: ____________ Movements: ____________ Start time: ____________ Finish time: ____________ °Date: ____________ Movements: ____________ Start time: ____________ Finish time: ____________ °Document Released: 06/27/2006 Document Revised: 10/12/2013 Document Reviewed: 03/24/2012 °[employer]® Patient Information ©2015 [employer]. This information is not intended to replace advice given to you by your health care provider. Make sure you discuss any questions you have with your health care provider. ° °

## 2015-02-10 LAB — URINE CYTOLOGY ANCILLARY ONLY
CHLAMYDIA, DNA PROBE: NEGATIVE
Neisseria Gonorrhea: NEGATIVE

## 2015-02-16 ENCOUNTER — Ambulatory Visit (INDEPENDENT_AMBULATORY_CARE_PROVIDER_SITE_OTHER): Payer: Medicaid Other | Admitting: Obstetrics and Gynecology

## 2015-02-16 VITALS — BP 91/66 | HR 100 | Temp 98.2°F | Wt 125.3 lb

## 2015-02-16 DIAGNOSIS — O99019 Anemia complicating pregnancy, unspecified trimester: Secondary | ICD-10-CM | POA: Diagnosis present

## 2015-02-16 NOTE — Progress Notes (Signed)
Breastfeeding tip of the week reviewed Pt declined Flu vaccine

## 2015-02-16 NOTE — Progress Notes (Signed)
Subjective:  Deanna Warren is a 24 y.o. G1P0 at [redacted]w[redacted]d being seen today for ongoing prenatal care.  Patient reports intermittent contractions.  Contractions: Irritability.  Vag. Bleeding: None. Movement: Present. Denies leaking of fluid.   The following portions of the patient's history were reviewed and updated as appropriate: allergies, current medications, past family history, past medical history, past social history, past surgical history and problem list.   Objective:   Filed Vitals:   02/16/15 1142  BP: 91/66  Pulse: 100  Temp: 98.2 F (36.8 C)  Weight: 125 lb 4.8 oz (56.836 kg)    Fetal Status: Fetal Heart Rate (bpm): 159 Fundal Height: 39 cm Movement: Present  Presentation: Vertex  General:  Alert, oriented and cooperative. Patient is in no acute distress.  Skin: Skin is warm and dry. No rash noted.   Cardiovascular: Normal heart rate noted  Respiratory: Normal respiratory effort, no problems with respiration noted  Abdomen: Soft, gravid, appropriate for gestational age. Pain/Pressure: Present     Pelvic: Vag. Bleeding: None Vag D/C Character: White   Cervical exam deferred        Extremities: Normal range of motion.  Edema: Trace  Mental Status: Normal mood and affect. Normal behavior. Normal judgment and thought content.   Urinalysis: Urine Protein: Negative Urine Glucose: Negative  Assessment and Plan:  Pregnancy: G1P0 at [redacted]w[redacted]d  1. Anemia in pregnancy, unspecified trimester Last cbc reviewed, 10.1  2. Pregnancy - continue pnv, f/u 1 wk  Term labor symptoms and general obstetric precautions including but not limited to vaginal bleeding, contractions, leaking of fluid and fetal movement were reviewed in detail with the patient. Please refer to After Visit Summary for other counseling recommendations.  Return in about 1 week (around 02/23/2015).   Kathrynn Running, MD

## 2015-02-23 ENCOUNTER — Encounter (HOSPITAL_COMMUNITY): Payer: Self-pay

## 2015-02-23 ENCOUNTER — Inpatient Hospital Stay (HOSPITAL_COMMUNITY)
Admission: AD | Admit: 2015-02-23 | Discharge: 2015-02-23 | Disposition: A | Discharge status: Home / Self Care | Payer: 59 | Source: Ambulatory Visit | Attending: Obstetrics & Gynecology | Admitting: Obstetrics & Gynecology

## 2015-02-23 ENCOUNTER — Encounter: Payer: Medicaid Other | Admitting: Physician Assistant

## 2015-02-23 DIAGNOSIS — Z3493 Encounter for supervision of normal pregnancy, unspecified, third trimester: Secondary | ICD-10-CM

## 2015-02-23 DIAGNOSIS — O99013 Anemia complicating pregnancy, third trimester: Secondary | ICD-10-CM

## 2015-02-23 NOTE — MAU Note (Signed)
Pt. Complains of contractions starting at 130 this A.M.  States they have been about 12 mins apart

## 2015-02-23 NOTE — MAU Note (Signed)
Pt reports she has had ctx since about 1am. Denies any vag bleeding or SROM. Good fetal movement reported.

## 2015-02-23 NOTE — Progress Notes (Signed)
MD called back; reviewed strip with Dr. Lowry Ram, okay for pt. to go home.  Prolonged deceleration discussed, okay to D/C Pt. per MD

## 2015-02-23 NOTE — Discharge Instructions (Signed)
Braxton Hicks Contractions Contractions of the uterus can occur throughout pregnancy. Contractions are not always a sign that you are in labor.  WHAT ARE BRAXTON HICKS CONTRACTIONS?  Contractions that occur before labor are called Braxton Hicks contractions, or false labor. Toward the end of pregnancy (32-34 weeks), these contractions can develop more often and may become more forceful. This is not true labor because these contractions do not result in opening (dilatation) and thinning of the cervix. They are sometimes difficult to tell apart from true labor because these contractions can be forceful and people have different pain tolerances. You should not feel embarrassed if you go to the hospital with false labor. Sometimes, the only way to tell if you are in true labor is for your health care provider to look for changes in the cervix. If there are no prenatal problems or other health problems associated with the pregnancy, it is completely safe to be sent home with false labor and await the onset of true labor. HOW CAN YOU TELL THE DIFFERENCE BETWEEN TRUE AND FALSE LABOR? False Labor  The contractions of false labor are usually shorter and not as hard as those of true labor.   The contractions are usually irregular.   The contractions are often felt in the front of the lower abdomen and in the groin.   The contractions may go away when you walk around or change positions while lying down.   The contractions get weaker and are shorter lasting as time goes on.   The contractions do not usually become progressively stronger, regular, and closer together as with true labor.  True Labor  Contractions in true labor last 30-70 seconds, become very regular, usually become more intense, and increase in frequency.   The contractions do not go away with walking.   The discomfort is usually felt in the top of the uterus and spreads to the lower abdomen and low back.   True labor can be  determined by your health care provider with an exam. This will show that the cervix is dilating and getting thinner.  WHAT TO REMEMBER  Keep up with your usual exercises and follow other instructions given by your health care provider.   Take medicines as directed by your health care provider.   Keep your regular prenatal appointments.   Eat and drink lightly if you think you are going into labor.   If Braxton Hicks contractions are making you uncomfortable:   Change your position from lying down or resting to walking, or from walking to resting.   Sit and rest in a tub of warm water.   Drink 2-3 glasses of water. Dehydration may cause these contractions.   Do slow and deep breathing several times an hour.  WHEN SHOULD I SEEK IMMEDIATE MEDICAL CARE? Seek immediate medical care if:  Your contractions become stronger, more regular, and closer together.   You have fluid leaking or gushing from your vagina.   You have a fever.   You have vaginal bleeding.   You have continuous abdominal pain.   You have low back pain that you never had before.   You feel your baby's head pushing down and causing pelvic pressure.   Your baby is not moving as much as it used to.  Document Released: 05/28/2005 Document Revised: 06/02/2013 Document Reviewed: 03/09/2013 [facility] Patient Information 2015 [place], [place]. This information is not intended to replace advice given to you by your health care provider. Make sure you discuss any  questions you have with your health care provider. Fetal Movement Counts Patient Name: __________________________________________________ Patient Due Date: ____________________ Performing a fetal movement count is highly recommended in high-risk pregnancies, but it is good for every pregnant woman to do. Your health care provider may ask you to start counting fetal movements at 28 weeks of the pregnancy. Fetal movements often increase:  After  eating a full meal.  After physical activity.  After eating or drinking something sweet or cold.  At rest. Pay attention to when you feel the baby is most active. This will help you notice a pattern of your baby's sleep and wake cycles and what factors contribute to an increase in fetal movement. It is important to perform a fetal movement count at the same time each day when your baby is normally most active.  HOW TO COUNT FETAL MOVEMENTS  Find a quiet and comfortable area to sit or lie down on your left side. Lying on your left side provides the best blood and oxygen circulation to your baby.  Write down the day and time on a sheet of paper or in a journal.  Start counting kicks, flutters, swishes, rolls, or jabs in a 2-hour period. You should feel at least 10 movements within 2 hours.  If you do not feel 10 movements in 2 hours, wait 2-3 hours and count again. Look for a change in the pattern or not enough counts in 2 hours. SEEK MEDICAL CARE IF:  You feel less than 10 counts in 2 hours, tried twice.  There is no movement in over an hour.  The pattern is changing or taking longer each day to reach 10 counts in 2 hours.  You feel the baby is not moving as he or she usually does. Date: ____________ Movements: ____________ Start time: ____________ Doreatha Martin time: ____________  Date: ____________ Movements: ____________ Start time: ____________ Doreatha Martin time: ____________ Date: ____________ Movements: ____________ Start time: ____________ Doreatha Martin time: ____________ Date: ____________ Movements: ____________ Start time: ____________ Doreatha Martin time: ____________ Date: ____________ Movements: ____________ Start time: ____________ Doreatha Martin time: ____________ Date: ____________ Movements: ____________ Start time: ____________ Doreatha Martin time: ____________ Date: ____________ Movements: ____________ Start time: ____________ Doreatha Martin time: ____________ Date: ____________ Movements: ____________ Start time:  ____________ Doreatha Martin time: ____________  Date: ____________ Movements: ____________ Start time: ____________ Doreatha Martin time: ____________ Date: ____________ Movements: ____________ Start time: ____________ Doreatha Martin time: ____________ Date: ____________ Movements: ____________ Start time: ____________ Doreatha Martin time: ____________ Date: ____________ Movements: ____________ Start time: ____________ Doreatha Martin time: ____________ Date: ____________ Movements: ____________ Start time: ____________ Doreatha Martin time: ____________ Date: ____________ Movements: ____________ Start time: ____________ Doreatha Martin time: ____________ Date: ____________ Movements: ____________ Start time: ____________ Doreatha Martin time: ____________  Date: ____________ Movements: ____________ Start time: ____________ Doreatha Martin time: ____________ Date: ____________ Movements: ____________ Start time: ____________ Doreatha Martin time: ____________ Date: ____________ Movements: ____________ Start time: ____________ Doreatha Martin time: ____________ Date: ____________ Movements: ____________ Start time: ____________ Doreatha Martin time: ____________ Date: ____________ Movements: ____________ Start time: ____________ Doreatha Martin time: ____________ Date: ____________ Movements: ____________ Start time: ____________ Doreatha Martin time: ____________ Date: ____________ Movements: ____________ Start time: ____________ Doreatha Martin time: ____________  Date: ____________ Movements: ____________ Start time: ____________ Doreatha Martin time: ____________ Date: ____________ Movements: ____________ Start time: ____________ Doreatha Martin time: ____________ Date: ____________ Movements: ____________ Start time: ____________ Doreatha Martin time: ____________ Date: ____________ Movements: ____________ Start time: ____________ Doreatha Martin time: ____________ Date: ____________ Movements: ____________ Start time: ____________ Doreatha Martin time: ____________ Date: ____________ Movements: ____________ Start time: ____________ Doreatha Martin time: ____________ Date:  ____________ Movements: ____________ Start time: ____________ Doreatha Martin time: ____________  Date: ____________ Movements: ____________ Start time: ____________ Doreatha Martin time: ____________ Date: ____________ Movements: ____________ Start time: ____________ Doreatha Martin time: ____________ Date: ____________ Movements: ____________ Start time: ____________ Doreatha Martin time: ____________ Date: ____________ Movements: ____________ Start time: ____________ Doreatha Martin time: ____________ Date: ____________ Movements: ____________ Start time: ____________ Doreatha Martin time: ____________ Date: ____________ Movements: ____________ Start time: ____________ Doreatha Martin time: ____________ Date: ____________ Movements: ____________ Start time: ____________ Doreatha Martin time: ____________  Date: ____________ Movements: ____________ Start time: ____________ Doreatha Martin time: ____________ Date: ____________ Movements: ____________ Start time: ____________ Doreatha Martin time: ____________ Date: ____________ Movements: ____________ Start time: ____________ Doreatha Martin time: ____________ Date: ____________ Movements: ____________ Start time: ____________ Doreatha Martin time: ____________ Date: ____________ Movements: ____________ Start time: ____________ Doreatha Martin time: ____________ Date: ____________ Movements: ____________ Start time: ____________ Doreatha Martin time: ____________ Date: ____________ Movements: ____________ Start time: ____________ Doreatha Martin time: ____________  Date: ____________ Movements: ____________ Start time: ____________ Doreatha Martin time: ____________ Date: ____________ Movements: ____________ Start time: ____________ Doreatha Martin time: ____________ Date: ____________ Movements: ____________ Start time: ____________ Doreatha Martin time: ____________ Date: ____________ Movements: ____________ Start time: ____________ Doreatha Martin time: ____________ Date: ____________ Movements: ____________ Start time: ____________ Doreatha Martin time: ____________ Date: ____________ Movements: ____________ Start  time: ____________ Doreatha Martin time: ____________ Date: ____________ Movements: ____________ Start time: ____________ Doreatha Martin time: ____________  Date: ____________ Movements: ____________ Start time: ____________ Doreatha Martin time: ____________ Date: ____________ Movements: ____________ Start time: ____________ Doreatha Martin time: ____________ Date: ____________ Movements: ____________ Start time: ____________ Doreatha Martin time: ____________ Date: ____________ Movements: ____________ Start time: ____________ Doreatha Martin time: ____________ Date: ____________ Movements: ____________ Start time: ____________ Doreatha Martin time: ____________ Date: ____________ Movements: ____________ Start time: ____________ Doreatha Martin time: ____________ Document Released: 06/27/2006 Document Revised: 10/12/2013 Document Reviewed: 03/24/2012 [employer] Patient Information 2015 [employer]. This information is not intended to replace advice given to you by your health care provider. Make sure you discuss any questions you have with your health care provider.

## 2015-02-24 ENCOUNTER — Inpatient Hospital Stay (HOSPITAL_COMMUNITY): Payer: 59 | Admitting: Anesthesiology

## 2015-02-24 ENCOUNTER — Encounter (HOSPITAL_COMMUNITY): Payer: Self-pay | Admitting: *Deleted

## 2015-02-24 ENCOUNTER — Inpatient Hospital Stay (HOSPITAL_COMMUNITY)
Admission: AD | Admit: 2015-02-24 | Discharge: 2015-02-26 | DRG: 775 | Disposition: A | Discharge status: Home / Self Care | Payer: 59 | Source: Ambulatory Visit | Attending: Family Medicine | Admitting: Family Medicine

## 2015-02-24 DIAGNOSIS — Z3A40 40 weeks gestation of pregnancy: Secondary | ICD-10-CM | POA: Diagnosis present

## 2015-02-24 DIAGNOSIS — Z3491 Encounter for supervision of normal pregnancy, unspecified, first trimester: Secondary | ICD-10-CM

## 2015-02-24 DIAGNOSIS — IMO0001 Reserved for inherently not codable concepts without codable children: Secondary | ICD-10-CM

## 2015-02-24 DIAGNOSIS — O99013 Anemia complicating pregnancy, third trimester: Secondary | ICD-10-CM

## 2015-02-24 DIAGNOSIS — O9902 Anemia complicating childbirth: Secondary | ICD-10-CM

## 2015-02-24 DIAGNOSIS — D649 Anemia, unspecified: Secondary | ICD-10-CM

## 2015-02-24 LAB — TYPE AND SCREEN
ABO/RH(D): O POS
Antibody Screen: NEGATIVE

## 2015-02-24 LAB — CBC
HCT: 33.9 % — ABNORMAL LOW (ref 36.0–46.0)
HEMOGLOBIN: 11.1 g/dL — AB (ref 12.0–15.0)
MCH: 26.7 pg (ref 26.0–34.0)
MCHC: 32.7 g/dL (ref 30.0–36.0)
MCV: 81.5 fL (ref 78.0–100.0)
PLATELETS: 228 10*3/uL (ref 150–400)
RBC: 4.16 MIL/uL (ref 3.87–5.11)
RDW: 16.2 % — ABNORMAL HIGH (ref 11.5–15.5)
WBC: 16.4 10*3/uL — AB (ref 4.0–10.5)

## 2015-02-24 LAB — RAPID HIV SCREEN (HIV 1/2 AB+AG)
HIV 1/2 Antibodies: NONREACTIVE
HIV-1 P24 ANTIGEN - HIV24: NONREACTIVE

## 2015-02-24 LAB — ABO/RH: ABO/RH(D): O POS

## 2015-02-24 MED ORDER — WITCH HAZEL-GLYCERIN EX PADS: 1.0000 "application " | MEDICATED_PAD | CUTANEOUS | Status: DC | PRN

## 2015-02-24 MED ORDER — ONDANSETRON HCL 4 MG/2ML IJ SOLN: 4.0000 mg | INTRAMUSCULAR | Status: DC | PRN

## 2015-02-24 MED ORDER — DOCUSATE SODIUM 100 MG PO CAPS
100.0000 mg | ORAL_CAPSULE | Freq: Two times a day (BID) | ORAL | Status: DC
  Administered 2015-02-25 – 2015-02-26 (×4): 100 mg via ORAL
  Filled 2015-02-24 (×4): qty 1

## 2015-02-24 MED ORDER — OXYCODONE-ACETAMINOPHEN 5-325 MG PO TABS: 2.0000 | ORAL_TABLET | ORAL | Status: DC | PRN

## 2015-02-24 MED ORDER — LACTATED RINGERS IV SOLN
INTRAVENOUS | Status: DC
  Administered 2015-02-24 (×2): via INTRAVENOUS

## 2015-02-24 MED ORDER — LIDOCAINE HCL (PF) 1 % IJ SOLN
INTRAMUSCULAR | Status: DC | PRN
  Administered 2015-02-24: 9 mL via EPIDURAL
  Administered 2015-02-24: 8 mL via EPIDURAL

## 2015-02-24 MED ORDER — IBUPROFEN 600 MG PO TABS
600.0000 mg | ORAL_TABLET | Freq: Four times a day (QID) | ORAL | Status: DC
  Administered 2015-02-24 – 2015-02-26 (×8): 600 mg via ORAL
  Filled 2015-02-24 (×8): qty 1

## 2015-02-24 MED ORDER — LACTATED RINGERS IV SOLN
500.0000 mL | INTRAVENOUS | Status: DC | PRN
  Administered 2015-02-24: 500 mL via INTRAVENOUS

## 2015-02-24 MED ORDER — PRENATAL MULTIVITAMIN CH
1.0000 | ORAL_TABLET | Freq: Every day | ORAL | Status: DC
  Administered 2015-02-25 – 2015-02-26 (×2): 1 via ORAL
  Filled 2015-02-24 (×2): qty 1

## 2015-02-24 MED ORDER — OXYTOCIN BOLUS FROM INFUSION
500.0000 mL | INTRAVENOUS | Status: DC
  Administered 2015-02-24: 500 mL via INTRAVENOUS

## 2015-02-24 MED ORDER — OXYCODONE-ACETAMINOPHEN 5-325 MG PO TABS: 1.0000 | ORAL_TABLET | ORAL | Status: DC | PRN

## 2015-02-24 MED ORDER — EPHEDRINE 5 MG/ML INJ
10.0000 mg | INTRAVENOUS | Status: DC | PRN
  Filled 2015-02-24: qty 2

## 2015-02-24 MED ORDER — SIMETHICONE 80 MG PO CHEW: 80.0000 mg | CHEWABLE_TABLET | ORAL | Status: DC | PRN

## 2015-02-24 MED ORDER — LIDOCAINE HCL (PF) 1 % IJ SOLN
30.0000 mL | INTRAMUSCULAR | Status: AC | PRN
  Administered 2015-02-24: 30 mL via SUBCUTANEOUS
  Filled 2015-02-24: qty 30

## 2015-02-24 MED ORDER — SENNOSIDES-DOCUSATE SODIUM 8.6-50 MG PO TABS: 2.0000 | ORAL_TABLET | Freq: Every evening | ORAL | Status: DC | PRN

## 2015-02-24 MED ORDER — DIPHENHYDRAMINE HCL 50 MG/ML IJ SOLN: 12.5000 mg | INTRAMUSCULAR | Status: DC | PRN

## 2015-02-24 MED ORDER — PHENYLEPHRINE 40 MCG/ML (10ML) SYRINGE FOR IV PUSH (FOR BLOOD PRESSURE SUPPORT)
80.0000 ug | PREFILLED_SYRINGE | INTRAVENOUS | Status: DC | PRN
  Filled 2015-02-24: qty 20
  Filled 2015-02-24: qty 2

## 2015-02-24 MED ORDER — DIBUCAINE 1 % RE OINT: 1.0000 "application " | TOPICAL_OINTMENT | RECTAL | Status: DC | PRN

## 2015-02-24 MED ORDER — ZOLPIDEM TARTRATE 5 MG PO TABS: 5.0000 mg | ORAL_TABLET | Freq: Every evening | ORAL | Status: DC | PRN

## 2015-02-24 MED ORDER — ACETAMINOPHEN 325 MG PO TABS: 650.0000 mg | ORAL_TABLET | ORAL | Status: DC | PRN

## 2015-02-24 MED ORDER — OXYTOCIN 40 UNITS IN LACTATED RINGERS INFUSION - SIMPLE MED
62.5000 mL/h | INTRAVENOUS | Status: DC
  Filled 2015-02-24: qty 1000

## 2015-02-24 MED ORDER — FENTANYL 2.5 MCG/ML BUPIVACAINE 1/10 % EPIDURAL INFUSION (WH - ANES)
14.0000 mL/h | INTRAMUSCULAR | Status: DC | PRN
  Administered 2015-02-24: 14 mL/h via EPIDURAL
  Filled 2015-02-24: qty 125

## 2015-02-24 MED ORDER — LANOLIN HYDROUS EX OINT: TOPICAL_OINTMENT | CUTANEOUS | Status: DC | PRN

## 2015-02-24 MED ORDER — DIPHENHYDRAMINE HCL 25 MG PO CAPS: 25.0000 mg | ORAL_CAPSULE | Freq: Four times a day (QID) | ORAL | Status: DC | PRN

## 2015-02-24 MED ORDER — ONDANSETRON HCL 4 MG/2ML IJ SOLN: 4.0000 mg | Freq: Four times a day (QID) | INTRAMUSCULAR | Status: DC | PRN

## 2015-02-24 MED ORDER — BENZOCAINE-MENTHOL 20-0.5 % EX AERO
1.0000 | INHALATION_SPRAY | CUTANEOUS | Status: DC | PRN
Start: 2015-02-24 — End: 2015-02-26
  Administered 2015-02-24: 1 via TOPICAL
  Filled 2015-02-24: qty 56

## 2015-02-24 MED ORDER — CITRIC ACID-SODIUM CITRATE 334-500 MG/5ML PO SOLN: 30.0000 mL | ORAL | Status: DC | PRN

## 2015-02-24 MED ORDER — ONDANSETRON HCL 4 MG PO TABS: 4.0000 mg | ORAL_TABLET | ORAL | Status: DC | PRN

## 2015-02-24 MED ORDER — TETANUS-DIPHTH-ACELL PERTUSSIS 5-2.5-18.5 LF-MCG/0.5 IM SUSP: 0.5000 mL | Freq: Once | INTRAMUSCULAR | Status: DC

## 2015-02-24 NOTE — Anesthesia Preprocedure Evaluation (Signed)
Anesthesia Evaluation  Patient identified by MRN, date of birth, ID band Patient awake    Reviewed: Allergy & Precautions, H&P , Patient's Chart, lab work & pertinent test results  Airway Mallampati: I  TM Distance: >3 FB Neck ROM: full    Dental no notable dental hx.    Pulmonary neg pulmonary ROS,    Pulmonary exam normal        Cardiovascular negative cardio ROS Normal cardiovascular exam     Neuro/Psych negative neurological ROS  negative psych ROS   GI/Hepatic negative GI ROS, Neg liver ROS,   Endo/Other  negative endocrine ROS  Renal/GU negative Renal ROS     Musculoskeletal   Abdominal Normal abdominal exam  (+)   Peds  Hematology   Anesthesia Other Findings   Reproductive/Obstetrics (+) Pregnancy                             Anesthesia Physical Anesthesia Plan  ASA: II  Anesthesia Plan: Epidural   Post-op Pain Management:    Induction:   Airway Management Planned:   Additional Equipment:   Intra-op Plan:   Post-operative Plan:   Informed Consent: I have reviewed the patients History and Physical, chart, labs and discussed the procedure including the risks, benefits and alternatives for the proposed anesthesia with the patient or authorized representative who has indicated his/her understanding and acceptance.     Plan Discussed with:   Anesthesia Plan Comments:         Anesthesia Quick Evaluation

## 2015-02-24 NOTE — H&P (Signed)
LABOR ADMISSION HISTORY AND PHYSICAL  Deanna Warren is a 24 y.o. female G1P0 with IUP at [redacted]w[redacted]d by 6 wk [place] presenting for SOL. She reports +FM, + contractions, No LOF, no VB, no blurry vision, headaches or peripheral edema, and RUQ pain.  She plans on bottle feeding. She request Depo for birth control.  Dating: By 6 wk [place] --->  Estimated Date of Delivery: 02/21/15  Sono:    , CWD, normal anatomy, cephalic presentation, longitudinal lie, 334g, 52% EFW   Prenatal History/Complications:   Clinic  [facility] Prenatal Labs  Dating  6 wk ultrasound Blood type: O/POS/-- (02/10 1557)   Genetic Screen  Declined Antibody:NEG (02/10 1557)  Anatomic [place] Normal female Rubella: 1.84 (02/10 1557)  GTT Early:               Third trimester: 89 RPR: NON REAC (02/10 1557)   Flu vaccine  Declined HBsAg: NEGATIVE (02/10 1557)   TDaP vaccine       12/08/14                                      Rhogam: NA HIV: NONREACTIVE (02/10 1557)   GBS   negative                            (For PCN allergy, check sensitivities) GBS: NEGATIVE  Contraception  depo Pap: Negative; GC/CT negx2  Baby Food  Bottle   Circumcision  NA-female ([facility])   Pediatrician  Given list 12/08/14   Support Person  Rakim      Past Medical History: Past Medical History  Diagnosis Date  . Medical history non-contributory     Past Surgical History: Past Surgical History  Procedure Laterality Date  . No past surgeries      Obstetrical History: OB History    Gravida Para Term Preterm AB TAB SAB Ectopic Multiple Living   1               Social History: Social History   Social History  . Marital Status: Single    Spouse Name: N/A  . Number of Children: N/A  . Years of Education: N/A   Social History Main Topics  . Smoking status: Never Smoker   . Smokeless tobacco: Never Used  . Alcohol Use: No  . Drug Use: No  . Sexual Activity: Yes    Birth Control/ Protection: None   Other Topics Concern  . None    Social History Narrative    Family History: Family History  Problem Relation Age of Onset  . Hyperlipidemia Father     Allergies: No Known Allergies  Prescriptions prior to admission  Medication Sig Dispense Refill Last Dose  . Fe Fum-FePoly-FA-Vit C-Vit B3 (INTEGRA F) 125-1 MG CAPS Take 1 capsule by mouth daily. 30 capsule 1 Past Week at Unknown time  . Prenatal Vit-Fe Fumarate-FA (PRENATAL COMPLETE) 14-0.4 MG TABS Take 1 tablet by mouth daily. 15 each 0 Past Week at Unknown time     Review of Systems   All systems reviewed and negative except as stated in HPI  BP 120/76 mmHg  Pulse 121  Temp(Src) 97.8 F (36.6 C)  Resp 18  Ht 5' (1.524 m)  Wt 127 lb (57.607 kg)  BMI 24.80 kg/m2  LMP 05/17/2014 General appearance: alert, cooperative and mild distress Lungs: non-labored breathing Heart: regular  rate Abdomen: soft, non-tender; bowel sounds normal Extremities: no sign of DVT, edema Presentation: cephalic Fetal monitoringBaseline: 135 bpm Uterine activityDate/time of onset: 02/22/14, Frequency: Every 2-4 minutes, Duration: 70 seconds and Intensity: moderate Dilation: 6 Effacement (%): 100 Station: -1 Exam by:: Elie Confer rn   Results for orders placed or performed during the hospital encounter of 02/24/15 (from the past 24 hour(s))  CBC   Collection Time: 02/24/15 10:39 AM  Result Value Ref Range   WBC 16.4 (H) 4.0 - 10.5 K/uL   RBC 4.16 3.87 - 5.11 MIL/uL   Hemoglobin 11.1 (L) 12.0 - 15.0 g/dL   HCT 98.1 (L) 19.1 - 47.8 %   MCV 81.5 78.0 - 100.0 fL   MCH 26.7 26.0 - 34.0 pg   MCHC 32.7 30.0 - 36.0 g/dL   RDW 29.5 (H) 62.1 - 30.8 %   Platelets 228 150 - 400 K/uL    Patient Active Problem List   Diagnosis Date Noted  . Active labor at term 02/24/2015  . Anemia in pregnancy 12/09/2014  . Supervision of normal pregnancy in first trimester 07/21/2014    Assessment: Deanna Warren is a 24 y.o. G1P0 at [redacted]w[redacted]d here for SOL.  #Labor:Expectant  management #Pain: Epidural #FWB: Category 1 #ID:  GBS neg #MOF: Bottle #MOC:Depo  Tarri Abernethy, MD PGY-1 [facility]   OB fellow attestation: I have seen and examined this patient; I agree with above documentation in the resident's note.   Deanna Warren is a 24 y.o. G1P0 here for SOL  PE: BP 124/74 mmHg  Pulse 124  Temp(Src) 97.8 F (36.6 C)  Resp 18  Ht 5' (1.524 m)  Wt 127 lb (57.607 kg)  BMI 24.80 kg/m2  LMP 05/17/2014 Gen: calm comfortable, NAD Resp: normal effort, no distress Abd: gravid  ROS, labs, PMH reviewed  Plan: #Labor: admit to LD. Expectant management. Will augment with AROM if ctx not adequate #Pain: Epidural #FWB: Category 1 #ID:  GBS neg #MOF: Bottle #MOC:Depo Federico Flake, MD Family Medicine, OB Fellow 02/24/2015, 11:38 AM

## 2015-02-24 NOTE — Anesthesia Procedure Notes (Signed)
Epidural Patient location during procedure: OB Start time: 02/24/2015 11:24 AM End time: 02/24/2015 11:28 AM  Staffing Anesthesiologist: Leilani Able Performed by: anesthesiologist   Preanesthetic Checklist Completed: patient identified, surgical consent, pre-op evaluation, timeout performed, IV checked, risks and benefits discussed and monitors and equipment checked  Epidural Patient position: sitting Prep: site prepped and draped and DuraPrep Patient monitoring: continuous pulse ox and blood pressure Approach: midline Injection technique: LOR air  Needle:  Needle type: Tuohy  Needle gauge: 17 G Needle length: 9 cm and 9 Needle insertion depth: 5 cm cm Catheter type: closed end flexible Catheter size: 19 Gauge Catheter at skin depth: 10 cm Test dose: negative and Other  Assessment Sensory level: T9 Events: blood not aspirated, injection not painful, no injection resistance, negative IV test and no paresthesia  Additional Notes Reason for block:procedure for pain

## 2015-02-25 ENCOUNTER — Other Ambulatory Visit: Payer: Medicaid Other

## 2015-02-25 LAB — RPR: RPR: NONREACTIVE

## 2015-02-25 NOTE — Anesthesia Postprocedure Evaluation (Signed)
Anesthesia Post Note  Patient: Deanna Warren  Procedure(s) Performed: * No procedures listed *  Anesthesia type: Epidural  Patient location: Mother/Baby  Post pain: Pain level controlled  Post assessment: Post-op Vital signs reviewed  Last Vitals:  Filed Vitals:   02/25/15 0548  BP: 108/70  Pulse: 99  Temp: 36.7 C  Resp: 18    Post vital signs: Reviewed  Level of consciousness:alert  Complications: No apparent anesthesia complications

## 2015-02-25 NOTE — Progress Notes (Signed)
Post Partum Day 1 Subjective:  Deanna Warren is a 24 y.o. G1P1001 [redacted]w[redacted]d s/p SVD.  No acute events overnight.  Pt denies problems with ambulating, voiding or po intake.  She denies nausea or vomiting.  Pain is well controlled.  She has not had flatus. She has not had bowel movement.  Lochia Small.  Plan for birth control is Depo-Provera.  Method of Feeding: Bottle  Objective: Blood pressure 108/70, pulse 99, temperature 98 F (36.7 C), temperature source Oral, resp. rate 18, height 5' (1.524 m), weight 127 lb (57.607 kg), last menstrual period 05/17/2014, SpO2 99 %, unknown if currently breastfeeding.  Physical Exam:  General: alert, cooperative and no distress Lochia:normal flow Chest: CTAB Heart: RRR no m/r/g Abdomen: +BS, soft, nontender,  Uterine Fundus: firm, below level of umbilicus DVT Evaluation: No evidence of DVT seen on physical exam. Extremities: no edema   Recent Labs  02/24/15 1039  HGB 11.1*  HCT 33.9*    Assessment/Plan:  ASSESSMENT: Deanna Warren is a 24 y.o. G1P1001 [redacted]w[redacted]d s/p SVD  Plan for discharge tomorrow; Bottle feeding, Contraception: Depo   LOS: 1 day   Durenda Hurt 02/25/2015, 8:08 AM   I have seen and examined this patient and agree the above assessment.  Respiratory effort normal, lochia appropriate, legs negative,  pain level normal.  CRESENZO-DISHMAN,FRANCES 03/01/2015 8:52 AM

## 2015-02-25 NOTE — Progress Notes (Signed)
UR chart review completed.  

## 2015-02-26 MED ORDER — IBUPROFEN 600 MG PO TABS: 600.0000 mg | ORAL_TABLET | Freq: Four times a day (QID) | ORAL | Status: DC | PRN

## 2015-02-26 NOTE — Discharge Summary (Signed)
Obstetric Discharge Summary Reason for Admission: onset of labor Prenatal Procedures: none Intrapartum Procedures: spontaneous vaginal delivery Postpartum Procedures: none Complications-Operative and Postpartum: 2nd degree perineal laceration HEMOGLOBIN  Date Value Ref Range Status  02/24/2015 11.1* 12.0 - 15.0 g/dL Final   HCT  Date Value Ref Range Status  02/24/2015 33.9* 36.0 - 46.0 % Final   Deanna Warren is a 24yo G1 admitted @ 40.3wks in active labor. She progressed to SVD not long after her arrival, and by PPD#2 she is ready to discharge. She is bottlefeeding and desires Depo for contraception.  Physical Exam:  General: alert, cooperative and no distress Lochia: appropriate Uterine Fundus: firm DVT Evaluation: No evidence of DVT seen on physical exam.  Discharge Diagnoses: Term Pregnancy-delivered  Discharge Information: Date: 02/26/2015 Activity: pelvic rest Diet: routine Medications: PNV and Ibuprofen Condition: stable Instructions: refer to practice specific booklet Discharge to: home Follow-up Information    Follow up with [facility]. Schedule an appointment as soon as possible for a visit in 6 weeks.   Why:  For your postpartum appointment.   Contact information:   [address] [place] [address] [phone]      Newborn Data: Live born female  Birth Weight: 6 lb 8.8 oz (2970 g) APGAR: 8, 9  Home with mother.  Cam Hai CNM 02/26/2015, 9:33 AM

## 2015-02-26 NOTE — Discharge Instructions (Signed)

## 2015-04-06 ENCOUNTER — Ambulatory Visit (INDEPENDENT_AMBULATORY_CARE_PROVIDER_SITE_OTHER): Payer: 59 | Admitting: Advanced Practice Midwife

## 2015-04-06 VITALS — BP 105/44 | HR 77 | Temp 98.4°F | Wt 114.0 lb

## 2015-04-06 DIAGNOSIS — Z3042 Encounter for surveillance of injectable contraceptive: Secondary | ICD-10-CM

## 2015-04-06 DIAGNOSIS — Z3491 Encounter for supervision of normal pregnancy, unspecified, first trimester: Secondary | ICD-10-CM

## 2015-04-06 MED ORDER — MEDROXYPROGESTERONE ACETATE 150 MG/ML IM SUSP
150.0000 mg | Freq: Once | INTRAMUSCULAR | Status: AC
Start: 2015-04-06 — End: 2015-04-06
  Administered 2015-04-06: 150 mg via INTRAMUSCULAR

## 2015-04-06 NOTE — Addendum Note (Signed)
Addended by: Jill SideAY, DIANE L on: 04/06/2015 03:35 PM   Modules accepted: Orders

## 2015-04-06 NOTE — Progress Notes (Deleted)
Postpartum Visit Patient is here for a postpartum visit. She is {0-10:33138} weeks postpartum following a {delivery:12449}. I have fully reviewed the prenatal and intrapartum course. The delivery was at *** gestational weeks. Outcome: {delivery outcome:32078}. Anesthesia: {procedures; anesthesia:812}.  Postpartum course has been ***. Baby's course has been ***. Baby is feeding by {breast/bottle:69}. Bleeding {vag bleed:12292}. Bowel function is {normal:32111}. Bladder function is {normal:32111}. Patient {is/is not:9024} sexually active. Contraception method is {contraceptive method:5051}. Postpartum depression screening: {neg default:13464::"negative"}.

## 2015-04-06 NOTE — Progress Notes (Signed)
  Subjective:     Deanna Warren is a 24 y.o. female who presents for a postpartum visit. She is 6 weeks postpartum following a spontaneous vaginal delivery. I have fully reviewed the prenatal and intrapartum course. Outcome: spontaneous vaginal delivery. Anesthesia: epidural. Postpartum course has been normal. Baby's course has been normal. Baby is feeding by formula only. Bleeding no bleeding. Bowel function is normal. Bladder function is normal. Patient is not sexually active. Contraception method is none. Postpartum depression screening: negative.  The following portions of the patient's history were reviewed and updated as appropriate: allergies, current medications, past family history, past medical history, past social history, past surgical history and problem list.  Review of Systems A comprehensive review of systems was negative.   Objective:    BP 105/44 mmHg  Pulse 77  Temp(Src) 98.4 F (36.9 C)  Wt 114 lb (51.71 kg)  General:  alert, cooperative, appears stated age and no distress   Breasts:  inspection negative, no nipple discharge or bleeding, no masses or nodularity palpable  Lungs: no noted distress  Heart:    Abdomen: soft, non-tender; bowel sounds normal; no masses,  no organomegaly        Assessment:     Normal postpartum exam.    Plan:    1. Contraception: Depo-Provera injections 2. Follow up in: 3 months for Depo injection or as needed.

## 2015-06-23 ENCOUNTER — Ambulatory Visit (INDEPENDENT_AMBULATORY_CARE_PROVIDER_SITE_OTHER): Payer: 59

## 2015-06-23 VITALS — BP 114/54 | HR 93 | Temp 98.1°F | Wt 115.1 lb

## 2015-06-23 DIAGNOSIS — Z3042 Encounter for surveillance of injectable contraceptive: Secondary | ICD-10-CM | POA: Diagnosis not present

## 2015-06-23 MED ORDER — MEDROXYPROGESTERONE ACETATE 150 MG/ML IM SUSP
150.0000 mg | Freq: Once | INTRAMUSCULAR | Status: AC
  Administered 2015-06-23: 150 mg via INTRAMUSCULAR

## 2015-06-23 MED ORDER — MEDROXYPROGESTERONE ACETATE 150 MG/ML IM SUSP: 150.0000 mg | INTRAMUSCULAR | Status: DC

## 2015-09-08 ENCOUNTER — Ambulatory Visit: Payer: Medicaid Other

## 2015-09-12 ENCOUNTER — Ambulatory Visit (INDEPENDENT_AMBULATORY_CARE_PROVIDER_SITE_OTHER): Payer: Medicaid Other | Admitting: General Practice

## 2015-09-12 VITALS — BP 95/53 | HR 87 | Ht 60.0 in | Wt 118.0 lb

## 2015-09-12 DIAGNOSIS — Z3042 Encounter for surveillance of injectable contraceptive: Secondary | ICD-10-CM

## 2015-09-12 MED ORDER — MEDROXYPROGESTERONE ACETATE 150 MG/ML IM SUSP
150.0000 mg | Freq: Once | INTRAMUSCULAR | Status: AC
  Administered 2015-09-12: 150 mg via INTRAMUSCULAR

## 2015-12-07 ENCOUNTER — Ambulatory Visit (INDEPENDENT_AMBULATORY_CARE_PROVIDER_SITE_OTHER): Payer: Medicaid Other | Admitting: General Practice

## 2015-12-07 VITALS — BP 114/75 | HR 92 | Ht 60.0 in | Wt 113.0 lb

## 2015-12-07 DIAGNOSIS — Z111 Encounter for screening for respiratory tuberculosis: Secondary | ICD-10-CM

## 2015-12-07 DIAGNOSIS — R7611 Nonspecific reaction to tuberculin skin test without active tuberculosis: Secondary | ICD-10-CM

## 2015-12-07 DIAGNOSIS — Z3042 Encounter for surveillance of injectable contraceptive: Secondary | ICD-10-CM | POA: Diagnosis not present

## 2015-12-07 MED ORDER — MEDROXYPROGESTERONE ACETATE 150 MG/ML IM SUSP
150.0000 mg | Freq: Once | INTRAMUSCULAR | Status: AC
  Administered 2015-12-07: 150 mg via INTRAMUSCULAR

## 2015-12-09 ENCOUNTER — Encounter: Payer: Self-pay | Admitting: *Deleted

## 2015-12-09 ENCOUNTER — Ambulatory Visit (HOSPITAL_COMMUNITY)
Admission: RE | Admit: 2015-12-09 | Discharge: 2015-12-09 | Disposition: A | Discharge status: Home / Self Care | Payer: Medicaid Other | Source: Ambulatory Visit | Attending: Obstetrics & Gynecology | Admitting: Obstetrics & Gynecology

## 2015-12-09 DIAGNOSIS — R7611 Nonspecific reaction to tuberculin skin test without active tuberculosis: Secondary | ICD-10-CM | POA: Insufficient documentation

## 2015-12-09 NOTE — Progress Notes (Signed)
Pt in for tb skin test reading. Pt has red area that measures approx 15mm. Pt sent for chest xray.

## 2015-12-09 NOTE — Addendum Note (Signed)
Addended by: Sherre LainASH, AMANDA A on: 12/09/2015 09:53 AM   Modules accepted: Orders

## 2016-02-22 ENCOUNTER — Ambulatory Visit (INDEPENDENT_AMBULATORY_CARE_PROVIDER_SITE_OTHER): Payer: Medicaid Other | Admitting: *Deleted

## 2016-02-22 VITALS — BP 108/71 | HR 95

## 2016-02-22 DIAGNOSIS — Z3042 Encounter for surveillance of injectable contraceptive: Secondary | ICD-10-CM

## 2016-02-22 MED ORDER — MEDROXYPROGESTERONE ACETATE 150 MG/ML IM SUSP
150.0000 mg | Freq: Once | INTRAMUSCULAR | Status: AC
  Administered 2016-02-22: 150 mg via INTRAMUSCULAR

## 2016-04-16 ENCOUNTER — Encounter (HOSPITAL_COMMUNITY): Payer: Self-pay | Admitting: Emergency Medicine

## 2016-04-16 ENCOUNTER — Ambulatory Visit (HOSPITAL_COMMUNITY)
Admission: EM | Admit: 2016-04-16 | Discharge: 2016-04-16 | Disposition: A | Discharge status: Home / Self Care | Payer: BLUE CROSS/BLUE SHIELD | Attending: Family Medicine | Admitting: Family Medicine

## 2016-04-16 DIAGNOSIS — R5382 Chronic fatigue, unspecified: Secondary | ICD-10-CM

## 2016-04-16 DIAGNOSIS — R630 Anorexia: Secondary | ICD-10-CM

## 2016-04-16 DIAGNOSIS — R42 Dizziness and giddiness: Secondary | ICD-10-CM | POA: Diagnosis not present

## 2016-04-16 HISTORY — DX: Major depressive disorder, single episode, unspecified: F32.9

## 2016-04-16 HISTORY — DX: Depression, unspecified: F32.A

## 2016-04-16 LAB — POCT URINALYSIS DIP (DEVICE)
BILIRUBIN URINE: NEGATIVE
GLUCOSE, UA: NEGATIVE mg/dL
Hgb urine dipstick: NEGATIVE
Ketones, ur: NEGATIVE mg/dL
LEUKOCYTES UA: NEGATIVE
NITRITE: NEGATIVE
Protein, ur: 30 mg/dL — AB
SPECIFIC GRAVITY, URINE: 1.025 (ref 1.005–1.030)
UROBILINOGEN UA: 1 mg/dL (ref 0.0–1.0)
pH: 7 (ref 5.0–8.0)

## 2016-04-16 LAB — POCT I-STAT, CHEM 8
BUN: 11 mg/dL (ref 6–20)
CALCIUM ION: 1.15 mmol/L (ref 1.15–1.40)
CHLORIDE: 107 mmol/L (ref 101–111)
Creatinine, Ser: 0.7 mg/dL (ref 0.44–1.00)
Glucose, Bld: 104 mg/dL — ABNORMAL HIGH (ref 65–99)
HEMATOCRIT: 40 % (ref 36.0–46.0)
Hemoglobin: 13.6 g/dL (ref 12.0–15.0)
Potassium: 3.9 mmol/L (ref 3.5–5.1)
SODIUM: 142 mmol/L (ref 135–145)
TCO2: 22 mmol/L (ref 0–100)

## 2016-04-16 LAB — POCT PREGNANCY, URINE: PREG TEST UR: NEGATIVE

## 2016-04-16 MED ORDER — ONDANSETRON HCL 4 MG PO TABS: 4.0000 mg | ORAL_TABLET | Freq: Four times a day (QID) | ORAL | 0 refills | Status: DC

## 2016-04-16 MED ORDER — MEGESTROL ACETATE 400 MG/10ML PO SUSP: ORAL | 0 refills | Status: DC

## 2016-04-16 NOTE — ED Notes (Signed)
Urinalysis results not crossing over due to time change: Results as follows: GLU-Neg, BIL-Neg, KET-Neg, SG-1.025, BLO-Neg, pH-7.0, PRO-30 mg/dL, ZOX-0.9URO-1.0 E.U./dL, NIT-Neg, LEU-Neg. Results printed and shown to provider.

## 2016-04-16 NOTE — Discharge Instructions (Signed)
°Emergency Department Resource Guide °1) Find a Doctor and Pay Out of Pocket °Although you won't have to find out who is covered by your insurance plan, it is a good idea to ask around and get recommendations. You will then need to call the office and see if the doctor you have chosen will accept you as a new patient and what types of options they offer for patients who are self-pay. Some doctors offer discounts or will set up payment plans for their patients who do not have insurance, but you will need to ask so you aren't surprised when you get to your appointment. ° °2) Contact Your Local Health Department °Not all health departments have doctors that can see patients for sick visits, but many do, so it is worth a call to see if yours does. If you don't know where your local health department is, you can check in your phone book. The CDC also has a tool to help you locate your state's health department, and many state websites also have listings of all of their local health departments. ° °3) Find a Walk-in Clinic °If your illness is not likely to be very severe or complicated, you may want to try a walk in clinic. These are popping up all over the country in pharmacies, drugstores, and shopping centers. They're usually staffed by nurse practitioners or physician assistants that have been trained to treat common illnesses and complaints. They're usually fairly quick and inexpensive. However, if you have serious medical issues or chronic medical problems, these are probably not your best option. ° °No Primary Care Doctor: °- Call [facility] at  832-8000 - they can help you locate a primary care doctor that  accepts your insurance, provides certain services, etc. °- Physician Referral Service- 1-800-533-3463 ° °Chronic Pain Problems: °Organization         Address  Phone   Notes  °[facility]  (336) 297-2271 Patients need to be referred by their primary care doctor.  ° °Medication  Assistance: °Organization         Address  Phone   Notes  °[place] [facility] [address] °[place], [place] [address] (336) 641-8030 --Must be a resident of [place] °-- Must have NO insurance coverage whatsoever (no Medicaid/ Medicare, etc.) °-- The pt. MUST have a primary care doctor that directs their care regularly and follows them in the community °  °[facility]  (866) 331-1348   °[employer]  (888) 892-1162   ° °Agencies that provide inexpensive medical care: °Organization         Address  Phone   Notes  °[facility]  (336) 832-8035   °[facility]    (336) 832-7272   °[facility] [address] °[place], [place] [address] (336) 832-4777   °[facility] [address], °[place] (336) 271-4999   °[facility]    (336) 373-0678   °[facility]    (336) 272-1050   °[facility] ° [address], [place] Phone:  (336) 832-4444, Fax:  (336) 832-4440 Hours of Operation:  9 am - 6 pm, M-F.  Also accepts Medicaid/Medicare and self-pay.  °[facility] ° [address], [place] Phone: (336) 832-3150, Fax: (336) 832-3151. Hours of Operation:  8:30 am - 5:30 pm, M-F.  Also accepts Medicaid and self-pay.  °[facility] [place] [address], [place] Phone: (336) 878-6027   °[facility] [address], [place], [place] (336)723-1848, Ext. 123 Mondays & Thursdays: 7-9 AM.  First 15 patients are seen on a first come, first serve basis. °  ° °Medicaid-accepting [place] Providers: ° °Organization         Address  Phone   Notes  °[facility] [address], [place] (336) 641-2100 Also accepts self-pay patients.  °[facility] [address], [place] ° (336) 856-9996   °[facility] [address], [place]  (336) 288-8857   °[facility] [address] [place] [address], [place] (336) 299-7000   °Veita Bland [address], [place]  ° (336) 373-1557 Only accepts [place] Access Medicaid patients after they have their name applied to their card.  ° °Self-Pay (no insurance) in [place]: ° °Organization         Address  Phone   Notes  °Sickle Cell Patients, [facility] [address], [place] (336) 832-1970   °[facility] [address], [place] (336) 832-4400   °[facility] ° [address], [place] (336) 992-4800   °[facility]/Dr. Osei-Bonsu ° [address] [place] [address], [place] or [address], [place] (336) 841-8500 Phone number for both [place] and [place] locations is the same.  °[facility] [address], [place] (336) 299-0000   °[facility] [address] [place] [address], [place] or [address] (336) 852-7530 °(336) 878-2260   °[facility] [address], [place] (336) 350-1642, phone; (336) 294-5005, fax Sees patients 1st and 3rd Saturday of every month.  Must not qualify for public or private insurance (i.e. Medicaid, Medicare, [place] Health Choice, Veterans' Benefits) • Household income should be no more than 200% of the poverty level •The clinic cannot treat you if you are pregnant or think you are pregnant • Sexually transmitted diseases are not treated at the clinic.  ° ° °Dental Care: °Organization         Address  Phone  Notes  °[place] [facility] [address], [place] (336) 641-6152 Accepts children up to age 21 who are enrolled in Medicaid or [place] Health Choice; pregnant women with a Medicaid card; and children who have applied for Medicaid or [place] Health Choice, but were declined, whose parents can pay a reduced fee at time of service.  °[place]  [facility] [place]  [address], [place] (336) 641-7733 Accepts children up to age 21 who are enrolled in Medicaid or [place] Health Choice; pregnant women with a Medicaid card; and children who have applied for Medicaid or [place] Health Choice, but were declined, whose parents can pay a reduced fee at time of service.  °[facility] ° [address],  (336) 641-4533 Patients are seen by appointment only. Walk-ins are not accepted. [facility] will see patients 18 years of age and older. °Monday - Tuesday (8am-5pm) °Most Wednesdays (8:30-5pm) °$30 per visit, cash only  °[facility] ° [address], [place] (336) 641-4533 Patients are seen by appointment only. Walk-ins are not accepted. [facility] will see patients 18 years of age and older. °One   Wednesday Evening (Monthly: Volunteer Based).  $30 per visit, cash only  °[facility]  (919) 537-3737 for adults; Children under age 4, call Graduate Pediatric Dentistry at (919) [phone]. Children aged 4-14, please call (919) 537-3737 to request a pediatric application. ° Dental services are provided in all areas of dental care including fillings, crowns and bridges, complete and partial dentures, implants, gum treatment, root canals, and extractions. Preventive care is also provided. Treatment is provided to both adults and children. °Patients are selected via a lottery and there is often a waiting list. °  °[facility] [address], °[place] ° (336) 763-8833 [email or web address] °  °[facility] [address], [place], [place] (336)723-1848, Ext. 123 Second and Fourth Thursday of each month, opens at 6:30 AM; Clinic ends at 9 AM.  Patients are seen on a first-come first-served basis, and a limited number are seen during each clinic.  ° °[facility] ° [address], [place], [place] (336) 723-7904    Eligibility Requirements °You must have lived in [place], [place], or [place] counties for at least the last three months. °  You cannot be eligible for state or federal sponsored healthcare insurance, including Veterans Administration, Medicaid, or Medicare. °  You generally cannot be eligible for healthcare insurance through your employer.  °  How to apply: °Eligibility screenings are held every Tuesday and Wednesday afternoon from 1:00 pm until 4:00 pm. You do not need an appointment for the interview!  °[facility] [address], [place], [place] 336-631-2330   °[facility]  336-342-8273   °[facility]  336-703-3100   °[facility]  336-570-6415   ° °Behavioral Health Resources in the Community: °Intensive Outpatient Programs °Organization         Address  Phone  Notes  °[facility] [address], [place], [place] 336-878-6098   °[facility] [address], [place], [place] 336-832-9800   °[facility] [address], [place], [place] ° 336-882-2125   °[facility] [address],  °[place], [place] 1-800-853-5163 or 336-641-4981   °Substance Abuse Resources °Organization         Address  Phone  Notes  °[facility]  336-882-2125   °[facility]  336-784-9470   °[facility]  336-285-9073   °[facility]  336-845-3988   °Residential & Outpatient Substance Abuse Program  1-800-659-3381   °Psychological Services °Organization         Address  Phone  Notes  °[facility]  336- 832-9600   °[facility]  336- 378-7881   °[facility] [address], [place] 1-800-853-5163 or 336-641-4981   ° °Mobile Crisis Teams °Organization         Address  Phone  Notes  °[facility], Mobile Crisis Care Unit  1-877-626-1772   °Assertive °[facility] ° [address]  [place], [place] 336-834-9664   °Sharon DeEsch [address] °[place] 336-554-5454   ° °Self-Help/Support Groups °Organization         Address  Phone             Notes  °[facility]  - variety of support groups  336- 373-1402 Call for more information  °Narcotics Anonymous (NA), [facility] [address], °[place]  2 meetings at this location  ° °  Residential Treatment Programs °Organization         Address  Phone  Notes  °[facility] [address],    °[place]  1-866-801-8205   °[facility] ° [address], [place], [place] 704-293-8524   °[facility] [address], [place] 336-845-3988 Admissions: 8am-3pm M-F  °[facility] [address]    °[place], [place] 336-841-1104   °[facility] [address], [place], [place] 336-379-7146   °[facility] [address]  °[place], [place] 336-285-9073   °[facility] Intensive Outpatient [address], [place], [place] 336-852-3033   °[facility] [address]  °[place], [place] 1-877-615-2722 or 336-784-9470   °[facility] [address] [place], [place] 336-227-7417 Accepts Medicaid  °[facility] [address]  °[place] 1-800-659-3381 Substance Abuse/Addiction Treatment  ° °[place] Behavioral Health Resources °Organization         Address  Phone  Notes  °[facility]  (888) 581-9988   °Julie Brannon, PhD [address] [place], [place]   (336) 349-5553 or (336) 951-0000   °[facility]   [address] °[place], [place] (336) 349-4454   °[facility] [address], [place], [place] (336) 342-8316 Insurance/Medicaid/sponsorship through [facility]  °[facility] [address]                                    [place], [place] (336) 342-8316 Therapy/tele-psych/case    °[facility] [address]  ° [place], [place] (336) 349-2233    °Dr. Arfeen  (336) 349-4544   °[facility] [place]  [employer] [place] [facility] 1) [address], [place] °2) [address], [place] °3)  [address], [place] (336) 349-3220 °(336) 342-7768 ° °(336) 342-8140   °[place] [facility] (336) 342-1394 or (336) 342-3537 (After Hours)    ° ° °

## 2016-04-16 NOTE — ED Triage Notes (Signed)
Poor appetite for 4-6 years and has not seen a doctor for this.   Patient concerned for 10 pound weight loss in 8 months.   Dizzy and light headed for 3 years.    Reports these issues are ongoing and reoccurring.  Episodes are more frequent than they have been.,  Patient admits some symptoms noticed when diagnosed with depression 3 years ago.

## 2016-04-16 NOTE — ED Provider Notes (Signed)
CSN: [id number]     Arrival date & time 04/16/16  1139 History   First MD Initiated Contact with Patient 04/16/16 1401     Chief Complaint  Patient presents with  . Dizziness   (Consider location/radiation/quality/duration/timing/severity/associated sxs/prior Treatment) HPI Deanna Warren is a 25 y.o. female presenting to UC with c/o poor appetite for about 4-6 years and has not f/u with a provider for this.  She also reports having a 10 pounds weight loss over the last 8 months. She notes she did recently give birth to a healthy baby.  She is not breastfeeding.  She reports having intermittent dizziness, fatigue, and lightheaded for about 3 years.  She does admit some symptoms started when she was dx with depression 3 years ago but episodes of dizziness have become more frequent. She has not eaten a full meal for 5 days.  She notes she is able to drink fluids w/o difficulty but she feels she has no energy to chew her food.  Denies chest pain or SOB. Hx of anemia in the past.   Past Medical History:  Diagnosis Date  . Depression   . Medical history non-contributory    Past Surgical History:  Procedure Laterality Date  . NO PAST SURGERIES     Family History  Problem Relation Age of Onset  . Hyperlipidemia Father    Social History  Substance Use Topics  . Smoking status: Never Smoker  . Smokeless tobacco: Never Used  . Alcohol use Yes   OB History    Gravida Para Term Preterm AB Living   1 1 1     1    SAB TAB Ectopic Multiple Live Births         0 1     Review of Systems  Constitutional: Positive for appetite change and unexpected weight change. Negative for chills and fever.  Respiratory: Negative for cough, chest tightness and shortness of breath.   Cardiovascular: Negative for chest pain and palpitations.  Gastrointestinal: Positive for abdominal pain (generalized) and nausea ( minimal). Negative for constipation, diarrhea and vomiting.  Genitourinary: Negative  for decreased urine volume, dysuria, flank pain, frequency, hematuria, urgency and vaginal bleeding.  Musculoskeletal: Negative for back pain and myalgias.  Neurological: Positive for dizziness, weakness ( generalized), light-headedness and headaches.    Allergies  Patient has no known allergies.  Home Medications   Prior to Admission medications   Medication Sig Start Date End Date Taking? Authorizing Provider  Multiple Vitamin (MULTIVITAMIN) capsule Take 1 capsule by mouth daily.   Yes Historical Provider, MD  ibuprofen (ADVIL,MOTRIN) 600 MG tablet Take 1 tablet (600 mg total) by mouth every 6 (six) hours as needed. 02/26/15   Arabella MerlesKimberly D Shaw, CNM  medroxyPROGESTERone (DEPO-PROVERA) 150 MG/ML injection Inject 1 mL (150 mg total) into the muscle every 3 (three) months. 06/23/15   Federico FlakeKimberly Niles [place], MD  megestrol (MEGACE) 400 MG/10ML suspension 400mg  (10mL) once or twice daily to help with appetite 04/16/16   Junius FinnerErin O'Malley, PA-C  ondansetron (ZOFRAN) 4 MG tablet Take 1 tablet (4 mg total) by mouth every 6 (six) hours. 04/16/16   Junius FinnerErin O'Malley, PA-C   Meds Ordered and Administered this Visit  Medications - No data to display  BP 120/55 (BP Location: Left Arm)   Pulse 88   Temp 98.4 F (36.9 C) (Oral)   Resp 16   LMP 02/15/2016   SpO2 100%  No data found.   Physical Exam  Constitutional: She is oriented to  person, place, and time. She appears well-developed and well-nourished. No distress.  Pt sitting on exam bed, NAD. Non-toxic appearing. Pt appears to be appropriate weight for her height.  HENT:  Head: Normocephalic and atraumatic.  Right Ear: Tympanic membrane normal.  Left Ear: Tympanic membrane normal.  Nose: Nose normal.  Mouth/Throat: Uvula is midline, oropharynx is clear and moist and mucous membranes are normal.  Eyes: Conjunctivae are normal. No scleral icterus.  Neck: Normal range of motion. Neck supple.  Cardiovascular: Normal rate, regular rhythm and normal heart  sounds.   Pulmonary/Chest: Effort normal and breath sounds normal. No respiratory distress. She has no wheezes. She has no rales.  Abdominal: Soft. Bowel sounds are normal. She exhibits no distension and no mass. There is no tenderness. There is no rebound and no guarding.  Musculoskeletal: Normal range of motion.  Neurological: She is alert and oriented to person, place, and time.  Skin: Skin is warm and dry. She is not diaphoretic.  Nursing note and vitals reviewed.   Urgent Care Course   Clinical Course     Procedures (including critical care time)  Labs Review Labs Reviewed  POCT I-STAT, CHEM 8 - Abnormal; Notable for the following:       Result Value   Glucose, Bld 104 (*)    All other components within normal limits  POCT PREGNANCY, URINE    Imaging Review No results found.   MDM   1. Dizziness   2. Decreased appetite   3. Chronic fatigue    Pt presenting with increased frequency of dizziness and lightheadedness over the last few weeks but she has had issues with poor appetite for about 6-10 years. She has not had a PCP since she was 25yo.  Pt appears well, non-toxic. NAD. BMI is 20.7 Moist mucous membranes. UA: unremarkable Urine preg: negative istat chem-8: WNL, Hgb/Hct is WNL, improved from CBC drawn 1 year ago.  Reassured pt of normal labs in UC. Strongly encouraged PCP f/u. Resource guide provided. Rx: megace and zofran Home care instructions provided. Patient verbalized understanding and agreement with treatment plan.    Junius Finnerrin O'Malley, PA-C 04/16/16 1506

## 2016-05-09 ENCOUNTER — Ambulatory Visit (INDEPENDENT_AMBULATORY_CARE_PROVIDER_SITE_OTHER): Payer: BLUE CROSS/BLUE SHIELD | Admitting: *Deleted

## 2016-05-09 DIAGNOSIS — Z3042 Encounter for surveillance of injectable contraceptive: Secondary | ICD-10-CM

## 2016-05-09 MED ORDER — MEDROXYPROGESTERONE ACETATE 150 MG/ML IM SUSP
150.0000 mg | Freq: Once | INTRAMUSCULAR | Status: AC
  Administered 2016-05-09: 150 mg via INTRAMUSCULAR

## 2016-05-21 ENCOUNTER — Encounter (HOSPITAL_COMMUNITY): Payer: Self-pay | Admitting: Family Medicine

## 2016-05-21 ENCOUNTER — Ambulatory Visit (HOSPITAL_COMMUNITY)
Admission: EM | Admit: 2016-05-21 | Discharge: 2016-05-21 | Disposition: A | Discharge status: Home / Self Care | Payer: BLUE CROSS/BLUE SHIELD | Attending: Family Medicine | Admitting: Family Medicine

## 2016-05-21 DIAGNOSIS — R42 Dizziness and giddiness: Secondary | ICD-10-CM | POA: Diagnosis not present

## 2016-05-21 DIAGNOSIS — R5382 Chronic fatigue, unspecified: Secondary | ICD-10-CM | POA: Diagnosis not present

## 2016-05-21 NOTE — ED Provider Notes (Signed)
CSN: [id number]     Arrival date & time 05/21/16  1625 History   First MD Initiated Contact with Patient 05/21/16 1636     Chief Complaint  Patient presents with  . Letter for School/Work   (Consider location/radiation/quality/duration/timing/severity/associated sxs/prior Treatment) HPI  Deanna Warren is a 25yo female with PMH of depression. Reports that over the weekend she had dizziness, lightheadedness, and general weakness. Describes dizziness was "feeling like a feather" not similar to room spinning. This has already resolved. She reports that she needs a letter stating that she can return to work. Her symptoms are chronic and intermittent for the past 2 years and seem to occur more often in the past few months. Symptoms come on suddenly. She has not noticed a pattern to her symptoms; episodes can begin when she is sitting or during activity. Seems to occur when she has not been eating or drinking much. She has had appetite loss which she attributes to her depression; she is not on any medications. She works at [employer] 9am to [employer]. She drinks juice throughout the day but not much water. No chest pain. Reports of rapid heart beat at times during these episodes and shortness of breath with exertions at times. Symptoms are worse with standing and walking. No syncopal episodes. She has not established care with PCP.   Past Medical History:  Diagnosis Date  . Depression   . Medical history non-contributory    Past Surgical History:  Procedure Laterality Date  . NO PAST SURGERIES     Family History  Problem Relation Age of Onset  . Hyperlipidemia Father    Social History  Substance Use Topics  . Smoking status: Never Smoker  . Smokeless tobacco: Never Used  . Alcohol use Yes   OB History    Gravida Para Term Preterm AB Living   1 1 1     1    SAB TAB Ectopic Multiple Live Births         0 1     Review of Systems: as noted above  Allergies  Patient has no known  allergies.  Home Medications   Prior to Admission medications   Medication Sig Start Date End Date Taking? Authorizing Provider  ibuprofen (ADVIL,MOTRIN) 600 MG tablet Take 1 tablet (600 mg total) by mouth every 6 (six) hours as needed. 02/26/15   Arabella MerlesKimberly D Shaw, CNM  medroxyPROGESTERone (DEPO-PROVERA) 150 MG/ML injection Inject 1 mL (150 mg total) into the muscle every 3 (three) months. 06/23/15   Federico FlakeKimberly Niles Newton, MD  Multiple Vitamin (MULTIVITAMIN) capsule Take 1 capsule by mouth daily.    Historical Provider, MD  ondansetron (ZOFRAN) 4 MG tablet Take 1 tablet (4 mg total) by mouth every 6 (six) hours. 04/16/16   Junius FinnerErin O'Malley, PA-C   Meds Ordered and Administered this Visit  Medications - No data to display  BP 115/72 (BP Location: Right Arm)   Pulse 71   Temp 98.3 F (36.8 C) (Oral)   Resp 16   SpO2 100%  Orthostatic VS for the past 24 hrs:  BP- Lying Pulse- Lying BP- Sitting Pulse- Sitting BP- Standing at 0 minutes Pulse- Standing at 0 minutes  05/21/16 1716 112/71 82 111/56 74 114/74 73    Physical Exam  Constitutional: She is oriented to person, place, and time. She appears well-developed and well-nourished. No distress.  HENT:  Mouth/Throat: Oropharynx is clear and moist.  Eyes: Conjunctivae are normal. Right eye exhibits no discharge. Left eye exhibits no discharge.  Neck: Normal range of motion. Neck supple. No thyromegaly present.  Cardiovascular: Normal rate and regular rhythm.  Exam reveals no gallop and no friction rub.   No murmur heard. Pulmonary/Chest: Effort normal and breath sounds normal. No respiratory distress. She has no wheezes.  Abdominal: Soft. Bowel sounds are normal. She exhibits no distension and no mass. There is no tenderness. There is no rebound and no guarding.  Musculoskeletal: Normal range of motion.  Lymphadenopathy:    She has no cervical adenopathy.  Neurological: She is alert and oriented to person, place, and time.  Skin: Skin is warm  and dry. No rash noted. She is not diaphoretic.  Psychiatric: She has a normal mood and affect. Her behavior is normal. Thought content normal.    Urgent Care Course   Clinical Course   Patient's symptoms resolved over the weekend. She presented today to obtain a letter to return to work. These symptoms are chronic and intermittent in nature and possibly due to inadequate PO intake due to decreased appetite. Encouraged eating prior to going to work and eating/staying hydrated through out the day. Orthostatic vitals normal. Recommended establishing care with PCP.   Procedures : none  Labs Review Labs Reviewed - No data to display  Imaging Review No results found.   MDM   1. Intermittent lightheadedness   2. Chronic fatigue    Patient's symptoms resolved over the weekend. She presented today to obtain a letter to return to work. These symptoms are chronic and intermittent in nature and possibly due to inadequate PO intake due to decreased appetite. Encouraged eating prior to going to work and eating/staying hydrated through out the day. Normal orthostatic vitals Recommended establishing care with PCP.   Presentation, exam, assessment,and plan discussed with Dr. Anne HahnLauenstein   Deanna Zerkle G Jevonte Clanton, MD PGY 2 Family Medicine    Deanna HolterKanishka G Taline Nass, MD 05/21/16 [phone]

## 2016-05-21 NOTE — ED Triage Notes (Signed)
Over the weekend felt dizzy and weak.  Symptoms started Friday and ended Saturday.  Patient ready to go back to work, but boss wants a note saying she can work

## 2016-05-21 NOTE — Discharge Instructions (Signed)
Please try to eat a good breakfast before going to work. Try to eat through out the day. Also it is important to stay hydrated with water throughout the day. We recommend that you establish care with a primary care provider. Please call you insurance company to determine which providers are covered under your insurance.

## 2016-07-26 ENCOUNTER — Ambulatory Visit: Payer: BLUE CROSS/BLUE SHIELD

## 2016-09-20 ENCOUNTER — Encounter (HOSPITAL_COMMUNITY): Payer: Self-pay | Admitting: Emergency Medicine

## 2016-09-20 ENCOUNTER — Ambulatory Visit (HOSPITAL_COMMUNITY)
Admission: EM | Admit: 2016-09-20 | Discharge: 2016-09-20 | Disposition: A | Discharge status: Home / Self Care | Payer: Medicaid Other | Attending: Family Medicine | Admitting: Family Medicine

## 2016-09-20 DIAGNOSIS — R197 Diarrhea, unspecified: Secondary | ICD-10-CM

## 2016-09-20 DIAGNOSIS — R109 Unspecified abdominal pain: Secondary | ICD-10-CM

## 2016-09-20 DIAGNOSIS — N390 Urinary tract infection, site not specified: Secondary | ICD-10-CM

## 2016-09-20 DIAGNOSIS — Z3202 Encounter for pregnancy test, result negative: Secondary | ICD-10-CM

## 2016-09-20 LAB — POCT URINALYSIS DIP (DEVICE)
BILIRUBIN URINE: NEGATIVE
GLUCOSE, UA: NEGATIVE mg/dL
Hgb urine dipstick: NEGATIVE
KETONES UR: NEGATIVE mg/dL
NITRITE: NEGATIVE
PH: 6 (ref 5.0–8.0)
PROTEIN: 30 mg/dL — AB
Specific Gravity, Urine: >=1.03 (ref 1.005–1.030)
Urobilinogen, UA: 0.2 mg/dL (ref 0.0–1.0)

## 2016-09-20 LAB — POCT PREGNANCY, URINE: PREG TEST UR: NEGATIVE

## 2016-09-20 MED ORDER — SULFAMETHOXAZOLE-TRIMETHOPRIM 800-160 MG PO TABS: 1.0000 | ORAL_TABLET | Freq: Two times a day (BID) | ORAL | 0 refills | Status: AC

## 2016-09-20 NOTE — ED Provider Notes (Signed)
[facility]    CSN: [id number] Arrival date & time: 09/20/16  1315     History   Chief Complaint Chief Complaint  Patient presents with  . Abdominal Pain    HPI Deanna Warren is a 26 y.o. female.   Pt was suffering from generalized abdominal pain on Monday night and had several episodes of diarrhea.  After Monday night, these episodes the pain subsided.  Yesterday she began having RLQ intermittent sharp pain, not associated with any diarrhea. Pain is not constant and seems to come on randomly without any precipitating cause.  Patient had been on Depo-Provera and was due for her shot at the end of February but has not had any further Depo-Provera since her last shot 4 months ago. She's not having any urinary symptoms. She did have a little bit of bleeding at the end of February.      Past Medical History:  Diagnosis Date  . Depression   . Medical history non-contributory     Patient Active Problem List   Diagnosis Date Noted  . NSVD (normal spontaneous vaginal delivery) 02/24/2015    Past Surgical History:  Procedure Laterality Date  . NO PAST SURGERIES      OB History    Gravida Para Term Preterm AB Living   SAB TAB Ectopic Multiple Live Births         0 1       Home Medications    Prior to Admission medications   Medication Sig Start Date End Date Taking? Authorizing Provider  Multiple Vitamin (MULTIVITAMIN) capsule Take 1 capsule by mouth daily.    Historical Provider, MD  sulfamethoxazole-trimethoprim (BACTRIM DS,SEPTRA DS) 800-160 MG tablet Take 1 tablet by mouth 2 (two) times daily. 09/20/16 09/27/16  Elvina Sidle, MD    Family History Family History  Problem Relation Age of Onset  . Hyperlipidemia Father     Social History Social History  Substance Use Topics  . Smoking status: Never Smoker  . Smokeless tobacco: Never Used  . Alcohol use Yes     Allergies   Patient has no known  allergies.   Review of Systems Review of Systems  Constitutional: Negative.   HENT: Negative.   Gastrointestinal: Positive for abdominal pain and diarrhea.  Genitourinary: Negative.      Physical Exam Triage Vital Signs ED Triage Vitals [09/20/16 1335]  Enc Vitals Group     BP      Pulse      Resp      Temp      Temp src      SpO2      Weight      Height      Head Circumference      Peak Flow      Pain Score 8     Pain Loc      Pain Edu?      Excl. in GC?    No data found.   Updated Vital Signs BP 107/72 (BP Location: Left Arm)   Pulse 94   Temp 98.7 F (37.1 C) (Oral)   SpO2 100%    Physical Exam  Constitutional: She is oriented to person, place, and time. She appears well-developed and well-nourished.  HENT:  Right Ear: External ear normal.  Left Ear: External ear normal.  Mouth/Throat: Oropharynx is clear and moist.  Eyes: Conjunctivae and EOM are normal. Pupils are equal, round, and reactive  to light.  Neck: Normal range of motion. Neck supple.  Pulmonary/Chest: Effort normal.  Abdominal: Soft. There is tenderness.  Patient has some mild tenderness in the right lower quadrant with deep palpation.  Musculoskeletal: Normal range of motion.  Neurological: She is alert and oriented to person, place, and time.  Skin: Skin is warm and dry.  Nursing note and vitals reviewed.    UC Treatments / Results  Labs (all labs ordered are listed, but only abnormal results are displayed) Labs Reviewed  POCT URINALYSIS DIP (DEVICE) - Abnormal; Notable for the following:       Result Value   Protein, ur 30 (*)    Leukocytes, UA TRACE (*)    All other components within normal limits  URINE CULTURE  POCT PREGNANCY, URINE    EKG  EKG Interpretation None       Radiology No results found.  Procedures Procedures (including critical care time)  Medications Ordered in UC Medications - No data to display   Initial Impression / Assessment and Plan / UC  Course  I have reviewed the triage vital signs and the nursing notes.  Pertinent labs & imaging results that were available during my care of the patient were reviewed by me and considered in my medical decision making (see chart for details).     Final Clinical Impressions(s) / UC Diagnoses   Final diagnoses:  Lower urinary tract infectious disease    New Prescriptions New Prescriptions   SULFAMETHOXAZOLE-TRIMETHOPRIM (BACTRIM DS,SEPTRA DS) 800-160 MG TABLET    Take 1 tablet by mouth 2 (two) times daily.     Elvina Sidle, MD 09/20/16 [phone]

## 2016-09-20 NOTE — ED Triage Notes (Signed)
Pt was suffering from generalized abdominal pain on Monday night and had several episodes of diarrhea.  After these episodes the pain subsided.  Yesterday she began having RLQ intermittent sharp pain, not associated with any diarrhea.

## 2017-01-08 ENCOUNTER — Ambulatory Visit (INDEPENDENT_AMBULATORY_CARE_PROVIDER_SITE_OTHER): Payer: Self-pay | Admitting: *Deleted

## 2017-01-08 DIAGNOSIS — Z111 Encounter for screening for respiratory tuberculosis: Secondary | ICD-10-CM

## 2017-01-08 MED ORDER — TUBERCULIN PPD 5 UNIT/0.1ML ID SOLN
5.0000 [IU] | Freq: Once | INTRADERMAL | Status: AC
  Administered 2017-01-08: 5 [IU] via INTRADERMAL

## 2017-01-08 NOTE — Progress Notes (Signed)
Patient presents to clinic for TB skin testing. Tubercillin 0.751ml administered to right inner forearm. Patient tolerated well. Advised to return in 48-72 hours for test to be read. Understanding voiced.

## 2017-01-10 ENCOUNTER — Ambulatory Visit: Payer: Self-pay | Admitting: *Deleted

## 2017-01-10 ENCOUNTER — Encounter: Payer: Self-pay | Admitting: *Deleted

## 2017-01-10 DIAGNOSIS — Z111 Encounter for screening for respiratory tuberculosis: Secondary | ICD-10-CM

## 2017-01-10 NOTE — Progress Notes (Signed)
Here today for tb test reading which was positive and measured 15 mm.  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 Patient denies symtoms of night sweats, productive cough, etc. Discussed with provider and no chest xray ordered. Letter given to patient and informed her if her employer requires cxray to call and leave us a message and we will order it. She voices understanding.

## 2017-01-31 IMAGING — US US OB COMP +14 WK
1 series · 12 of 28 positions shown · non-contrast
Comparison: none

[Series 1: us ob +14 all · 113 acquisitions, 12 frames shown]
[im 5/113]
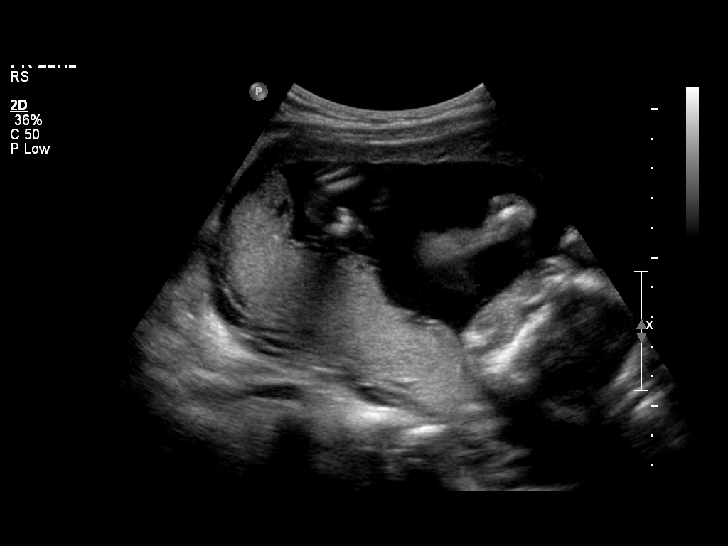
[im 13/113]
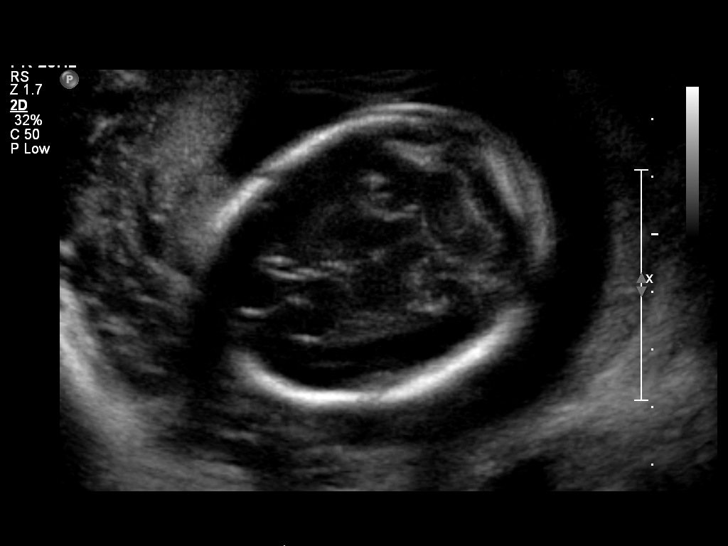
[im 21/113]
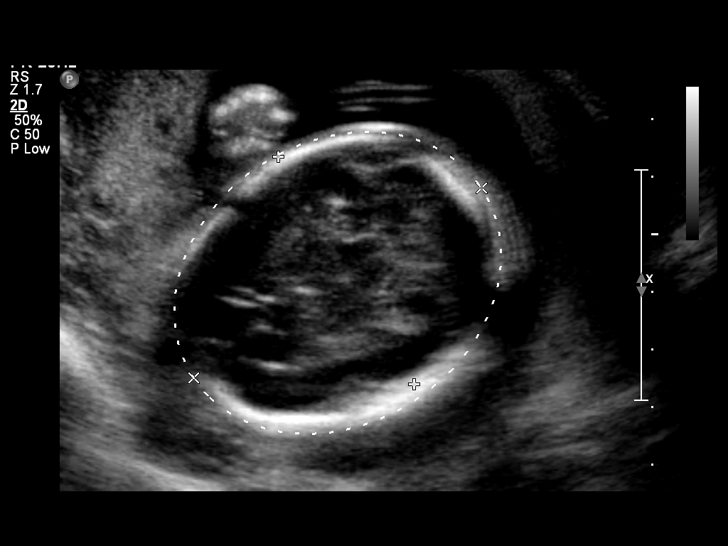
[im 34/113]
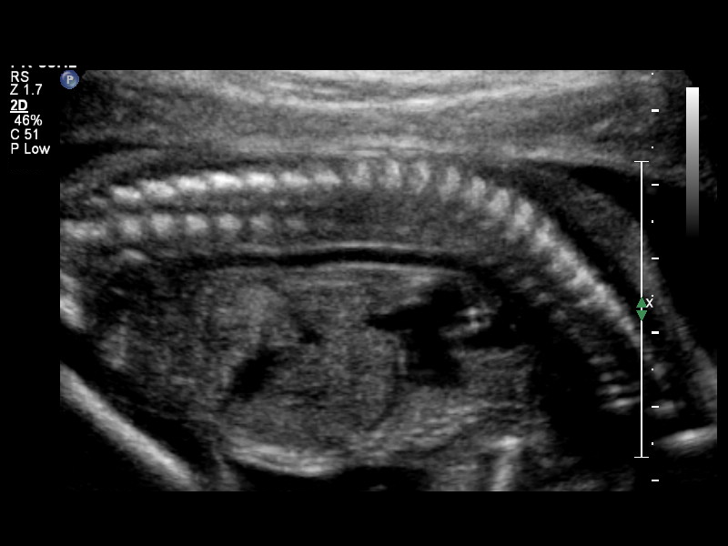
[im 42/113]
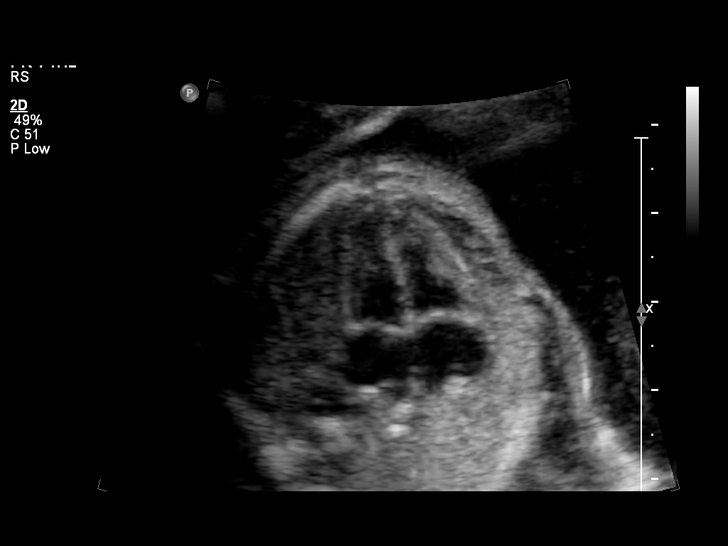
[im 50/113]
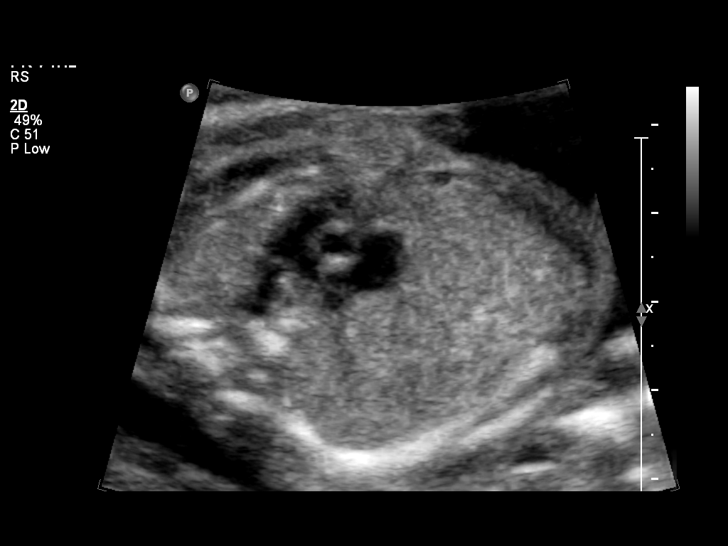
[im 63/113]
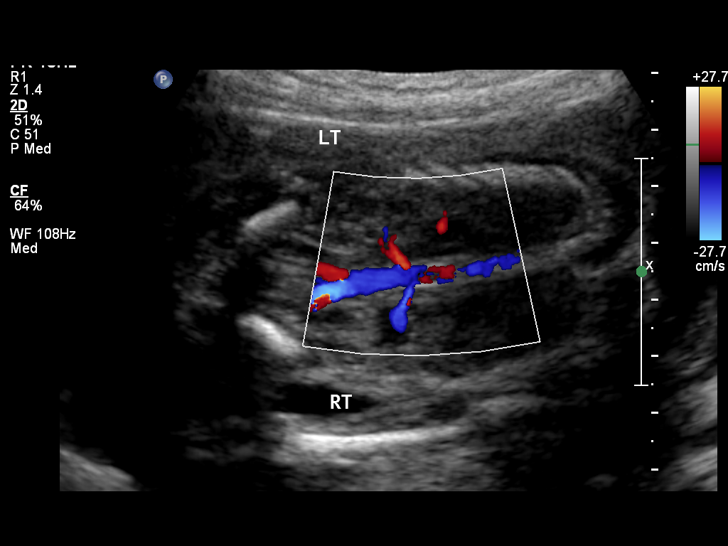
[im 71/113]
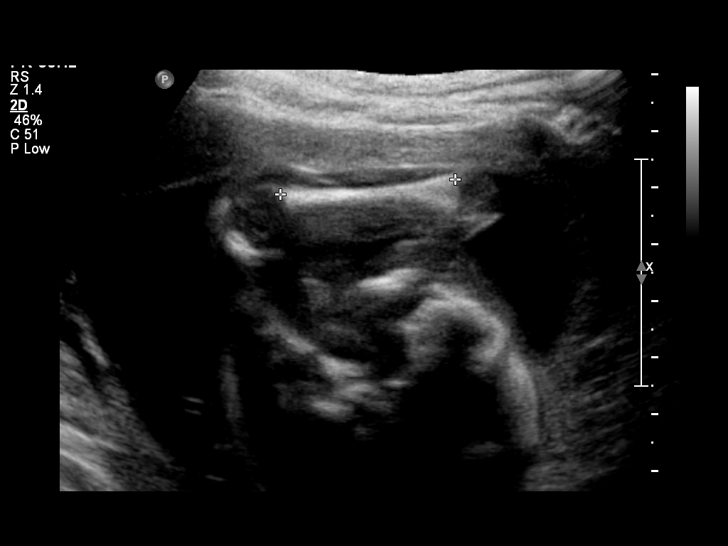
[im 79/113]
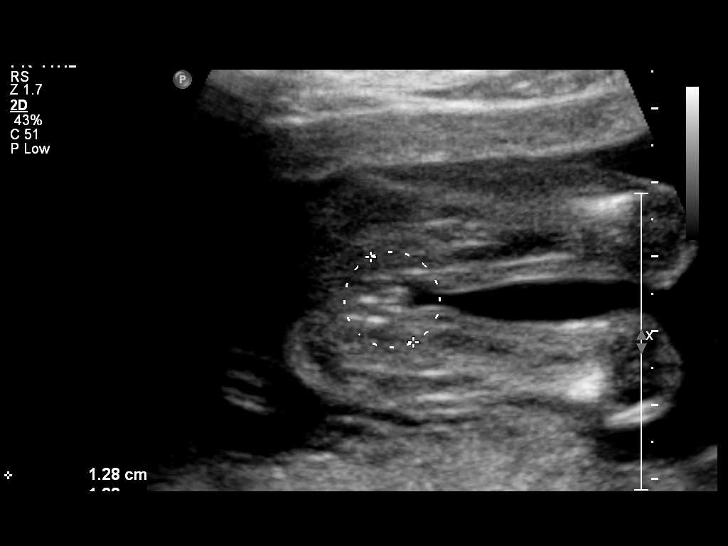
[im 92/113]
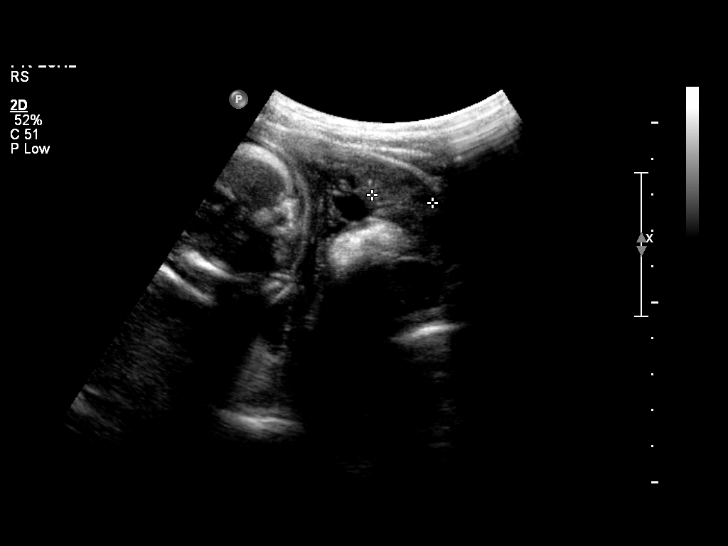
[im 100/113]
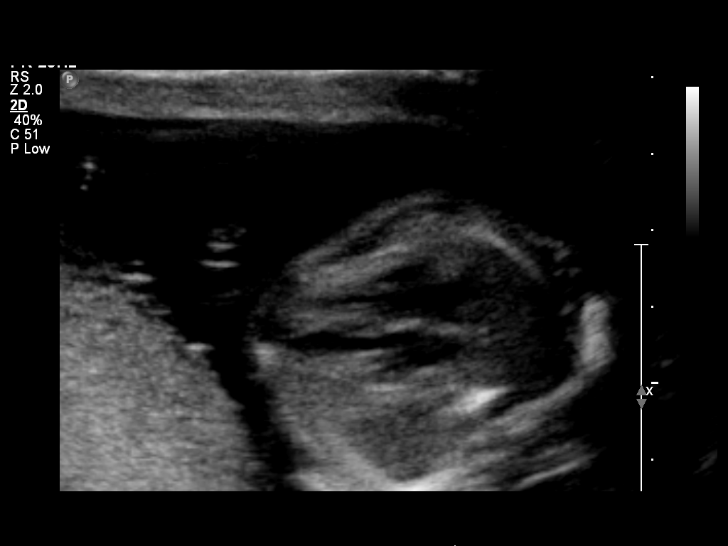
[im 108/113]
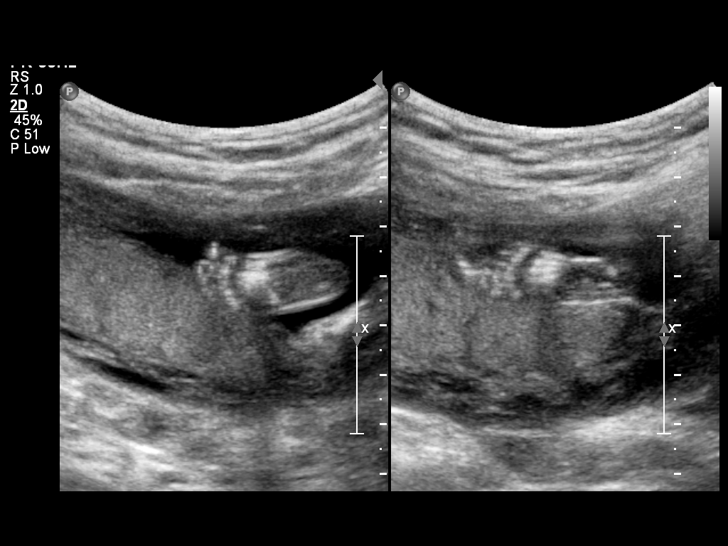

[12 of 28 positions shown; findings below may reference images not displayed]

OBSTETRICS REPORT
(Signed Final 10/04/2014 [DATE])

[facility]

Service(s) Provided

US OB COMP + 14 WK                                    76805.1
Indications

Basic anatomic survey                                 z36
20 weeks gestation of pregnancy
Fetal Evaluation

Num Of Fetuses:    1
Fetal Heart Rate:  144                          bpm
Cardiac Activity:  Observed
Presentation:      Cephalic
Placenta:          Posterior, above cervical
os
P. Cord            Visualized, central
Insertion:

Amniotic Fluid
AFI FV:      Subjectively within normal limits
Larg Pckt:    4.62  cm
Biometry

BPD:     46.2  mm     G. Age:  20w 0d                CI:         76.6   70 - 86
OFD:     60.3  mm                                    FL/HC:      18.8   16.8 -
19.8
HC:       172  mm     G. Age:  19w 6d       32  %    HC/AC:      1.15   1.09 -
1.39
AC:     149.7  mm     G. Age:  20w 1d       52  %    FL/BPD:
FL:      32.4  mm     G. Age:  20w 1d       46  %    FL/AC:      21.6   20 - 24
HUM:     31.5  mm     G. Age:  20w 4d       67  %
CER:     19.8  mm     G. Age:  18w 6d       23  %

Est. FW:     334  gm    0 lb 12 oz      52  %
Gestational Age

LMP:           20w 0d        Date:  05/17/14                 EDD:   02/21/15
U/S Today:     20w 0d                                        EDD:   02/21/15
Best:          20w 0d     Det. By:  LMP  (05/17/14)          EDD:   02/21/15
Anatomy
Cranium:          Appears normal         Aortic Arch:      Appears normal
Fetal Cavum:      Appears normal         Ductal Arch:      Appears normal
Ventricles:       Appears normal         Diaphragm:        Appears normal
Choroid Plexus:   Appears normal         Stomach:          Appears normal, left
sided
Cerebellum:       Appears normal         Abdomen:          Appears normal
Posterior Fossa:  Appears normal         Abdominal Wall:   Appears nml (cord
insert, abd wall)
Nuchal Fold:      Not applicable (>20    Cord Vessels:     Appears normal (3
wks GA)                                  vessel cord)
Face:             Profile appears        Kidneys:          Appear normal
normal
Lips:             Appears normal         Bladder:          Appears normal
Heart:            Appears normal         Spine:            Appears normal
(4CH, axis, and
situs)
RVOT:             Appears normal         Lower             Appears normal
Extremities:
LVOT:             Appears normal         Upper             Appears normal
Extremities:

Other:  Fetus appears to be a female. Heels and 5th digit visualized. Nasal
bone visualized. Technically difficult due to fetal position.
Targeted Anatomy

Fetal Central Nervous System
Lat. Ventricles:  5.4                    Cisterna Magna:
Cervix Uterus Adnexa

Cervical Length:    3.47     cm

Cervix:       Normal appearance by transabdominal scan.
Uterus:       No abnormality visualized.
Cul De Sac:   No free fluid seen.

Left Ovary:    No adnexal mass visualized.
Right Ovary:   No adnexal mass visualized.
Adnexa:     No abnormality visualized.
Impression

Single IUP at 20w 0d
Normal fetal anatomic survey
No markers associated with aneuploidy noted
Posterior placenta without previa
Normal amniotic fluid volume
Recommendations

Follow-up ultrasounds as clinically indicated.

## 2017-08-08 ENCOUNTER — Encounter: Payer: Self-pay | Admitting: *Deleted

## 2017-11-29 ENCOUNTER — Encounter (HOSPITAL_COMMUNITY): Payer: Self-pay | Admitting: Emergency Medicine

## 2017-11-29 ENCOUNTER — Ambulatory Visit (HOSPITAL_COMMUNITY)
Admission: EM | Admit: 2017-11-29 | Discharge: 2017-11-29 | Disposition: A | Discharge status: Home / Self Care | Payer: Medicaid Other | Attending: Internal Medicine | Admitting: Internal Medicine

## 2017-11-29 DIAGNOSIS — K5909 Other constipation: Secondary | ICD-10-CM | POA: Diagnosis not present

## 2017-11-29 DIAGNOSIS — R1013 Epigastric pain: Secondary | ICD-10-CM

## 2017-11-29 DIAGNOSIS — Z8719 Personal history of other diseases of the digestive system: Secondary | ICD-10-CM

## 2017-11-29 LAB — POCT URINALYSIS DIP (DEVICE)
Bilirubin Urine: NEGATIVE
GLUCOSE, UA: NEGATIVE mg/dL
Hgb urine dipstick: NEGATIVE
KETONES UR: NEGATIVE mg/dL
LEUKOCYTES UA: NEGATIVE
Nitrite: NEGATIVE
Protein, ur: NEGATIVE mg/dL
SPECIFIC GRAVITY, URINE: 1.025 (ref 1.005–1.030)
UROBILINOGEN UA: 0.2 mg/dL (ref 0.0–1.0)
pH: 6 (ref 5.0–8.0)

## 2017-11-29 LAB — POCT PREGNANCY, URINE: Preg Test, Ur: NEGATIVE

## 2017-11-29 MED ORDER — OMEPRAZOLE 20 MG PO CPDR: 20.0000 mg | DELAYED_RELEASE_CAPSULE | Freq: Every day | ORAL | 0 refills | Status: DC

## 2017-11-29 MED ORDER — POLYETHYLENE GLYCOL 3350 17 G PO PACK: 17.0000 g | PACK | Freq: Every day | ORAL | 0 refills | Status: DC

## 2017-11-29 NOTE — ED Provider Notes (Signed)
[facility]   [id number] 11/29/17 Arrival Time: 1206  SUBJECTIVE:  Cristy FriedlanderShanae C KILLINGS-THOMAS is a 27 y.o. female who presents with complaint of abdominal discomfort that began abruptly 1 month ago.  Denies a precipitating event, or trauma.  Localizes pain to left side.  Describes as intermittent and worsening and "cramp" in character.  Has tried OTC medications including ibuprofen without relief.  Denies alleviating or aggravating factors.  Denies worsening symptoms with eating foods or associated symptoms related to menstrual cycyle.  Reports similar symptoms in the past.  Last BM yesterday and normal for patient.  Complains of chills, nausea, vomiting x1, diarrhea, constipation, and increased urination.    Denies fever, appetite changes, weight changes, chest pain, SOB, diarrhea, constipation, hematochezia, melena, dysuria, difficulty urinating, increased urgency, loss of bowel or bladder function.  Patient's last menstrual period was 11/22/2017.  ROS: As per HPI.  Past Medical History:  Diagnosis Date  . Depression   . Medical history non-contributory    Past Surgical History:  Procedure Laterality Date  . NO PAST SURGERIES     No Known Allergies No current facility-administered medications on file prior to encounter.    Current Outpatient Medications on File Prior to Encounter  Medication Sig Dispense Refill  . Multiple Vitamin (MULTIVITAMIN) capsule Take 1 capsule by mouth daily.     Social History   Socioeconomic History  . Marital status: Single    Spouse name: Not on file  . Number of children: Not on file  . Years of education: Not on file  . Highest education level: Not on file  Occupational History  . Not on file  Social Needs  . Financial resource strain: Not on file  . Food insecurity:    Worry: Not on file    Inability: Not on file  . Transportation needs:    Medical: Not on file    Non-medical: Not on file  Tobacco Use  . Smoking status:  Never Smoker  . Smokeless tobacco: Never Used  Substance and Sexual Activity  . Alcohol use: Yes  . Drug use: Yes    Types: Marijuana  . Sexual activity: Yes    Birth control/protection: Injection  Lifestyle  . Physical activity:    Days per week: Not on file    Minutes per session: Not on file  . Stress: Not on file  Relationships  . Social connections:    Talks on phone: Not on file    Gets together: Not on file    Attends religious service: Not on file    Active member of club or organization: Not on file    Attends meetings of clubs or organizations: Not on file    Relationship status: Not on file  . Intimate partner violence:    Fear of current or ex partner: Not on file    Emotionally abused: Not on file    Physically abused: Not on file    Forced sexual activity: Not on file  Other Topics Concern  . Not on file  Social History Narrative  . Not on file   Family History  Problem Relation Age of Onset  . Hyperlipidemia Father      OBJECTIVE:  Vitals:   11/29/17 1255  BP: (!) 107/56  Pulse: 62  Resp: 16  Temp: 98.6 F (37 C)  SpO2: 100%    General appearance: AOx3 in no acute distress HEENT: NCAT.  Oropharynx clear.  Lungs: clear to auscultation bilaterally without adventitious breath sounds  Heart: regular rate and rhythm.  Radial pulses 2+ symmetrical bilaterally Abdomen: soft, non-distended; normal active bowel sounds; tender to palpation about the epigastric region; nontender at McBurney's point; negative Murphy's sign; negative rebound; guarding Back: no CVA tenderness Extremities: no edema; symmetrical with no gross deformities Skin: warm and dry Neurologic: normal gait Psychological: alert and cooperative; normal mood and affect  Labs: Results for orders placed or performed during the hospital encounter of 11/29/17 (from the past 24 hour(s))  POCT urinalysis dip (device)     Status: None   Collection Time: 11/29/17  1:20 PM  Result Value Ref  Range   Glucose, UA NEGATIVE NEGATIVE mg/dL   Bilirubin Urine NEGATIVE NEGATIVE   Ketones, ur NEGATIVE NEGATIVE mg/dL   Specific Gravity, Urine 1.025 1.005 - 1.030   Hgb urine dipstick NEGATIVE NEGATIVE   pH 6.0 5.0 - 8.0   Protein, ur NEGATIVE NEGATIVE mg/dL   Urobilinogen, UA 0.2 0.0 - 1.0 mg/dL   Nitrite NEGATIVE NEGATIVE   Leukocytes, UA NEGATIVE NEGATIVE  Pregnancy, urine POC     Status: None   Collection Time: 11/29/17  1:23 PM  Result Value Ref Range   Preg Test, Ur NEGATIVE NEGATIVE    ASSESSMENT & PLAN:  1. Other constipation   2. History of indigestion   3. Abdominal pain, epigastric     No orders of the defined types were placed in this encounter.  Urine did not show signs of infection Urine pregnancy was negative Increase water and fiber rich foods in your diet Miralax prescribed.  Take daily Omeprazole prescribed take daily.  Do not take if breast feeding. Follow up with PCP if symptoms persists.  PCP assistance initiated Return or go to the ED if you have any new or worsening symptoms such as severe pain, dizziness, fatigue, inability to hold down fluids, excessive vomiting or diarrhea, high fever or chills, etc...  Reviewed expectations re: course of current medical issues. Questions answered. Outlined signs and symptoms indicating need for more acute intervention. Patient verbalized understanding. After Visit Summary given.   Rennis Harding, PA-C 11/29/17 1335

## 2017-11-29 NOTE — Discharge Instructions (Addendum)
Urine did not show signs of infection Urine pregnancy was negative Increase water and fiber rich foods in your diet Miralax prescribed.  Take daily Omeprazole prescribed take daily.  Do not take if breast feeding. Follow up with PCP if symptoms persists.  PCP assistance initiated Return or go to the ED if you have any new or worsening symptoms such as severe pain, dizziness, fatigue, inability to hold down fluids, excessive vomiting or diarrhea, high fever or chills, etc...Marland Kitchen

## 2017-11-29 NOTE — ED Triage Notes (Signed)
Pt c/o LUQ abdominal pain x1 month, states she woke up yeasterday morning and vomited.

## 2018-04-07 IMAGING — CR DG CHEST 2V
2 series · 2 of 2 positions shown · non-contrast
Comparison: None.

CLINICAL DATA: Positive PPD TB test

EXAM:
CHEST  2 VIEW

[chest pa]
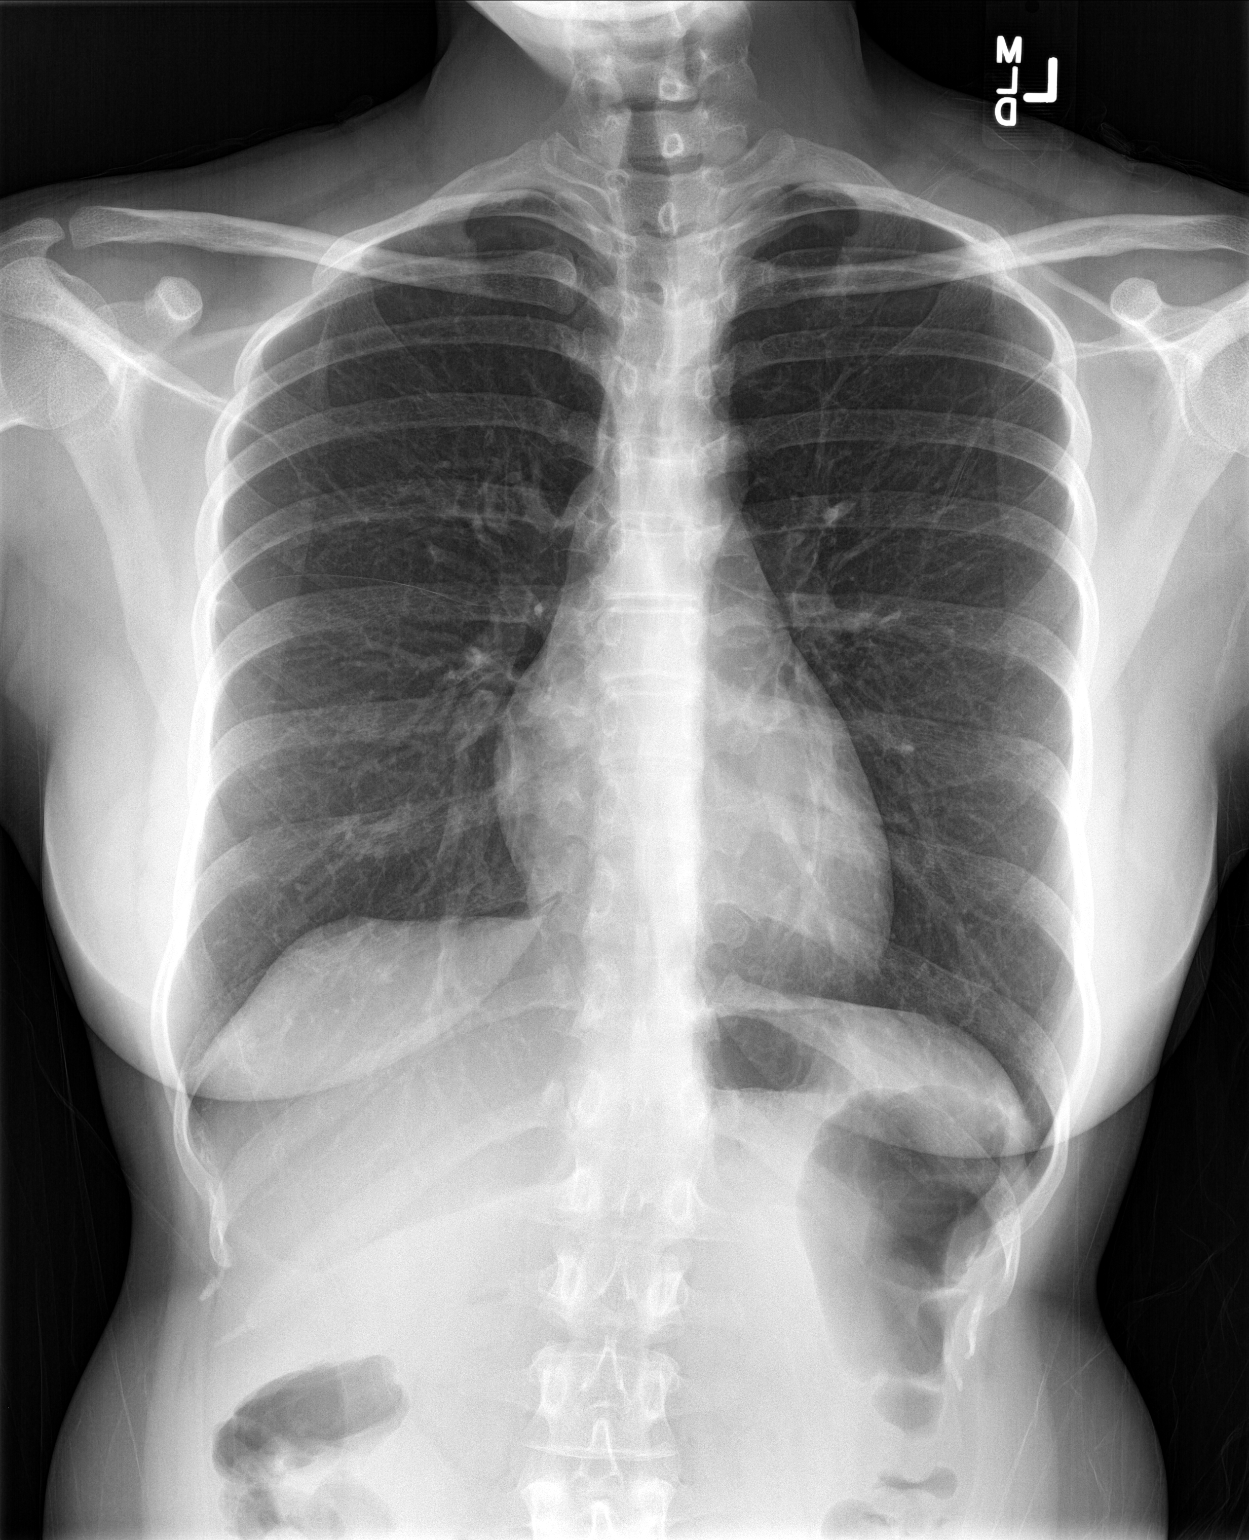

[chest lat]
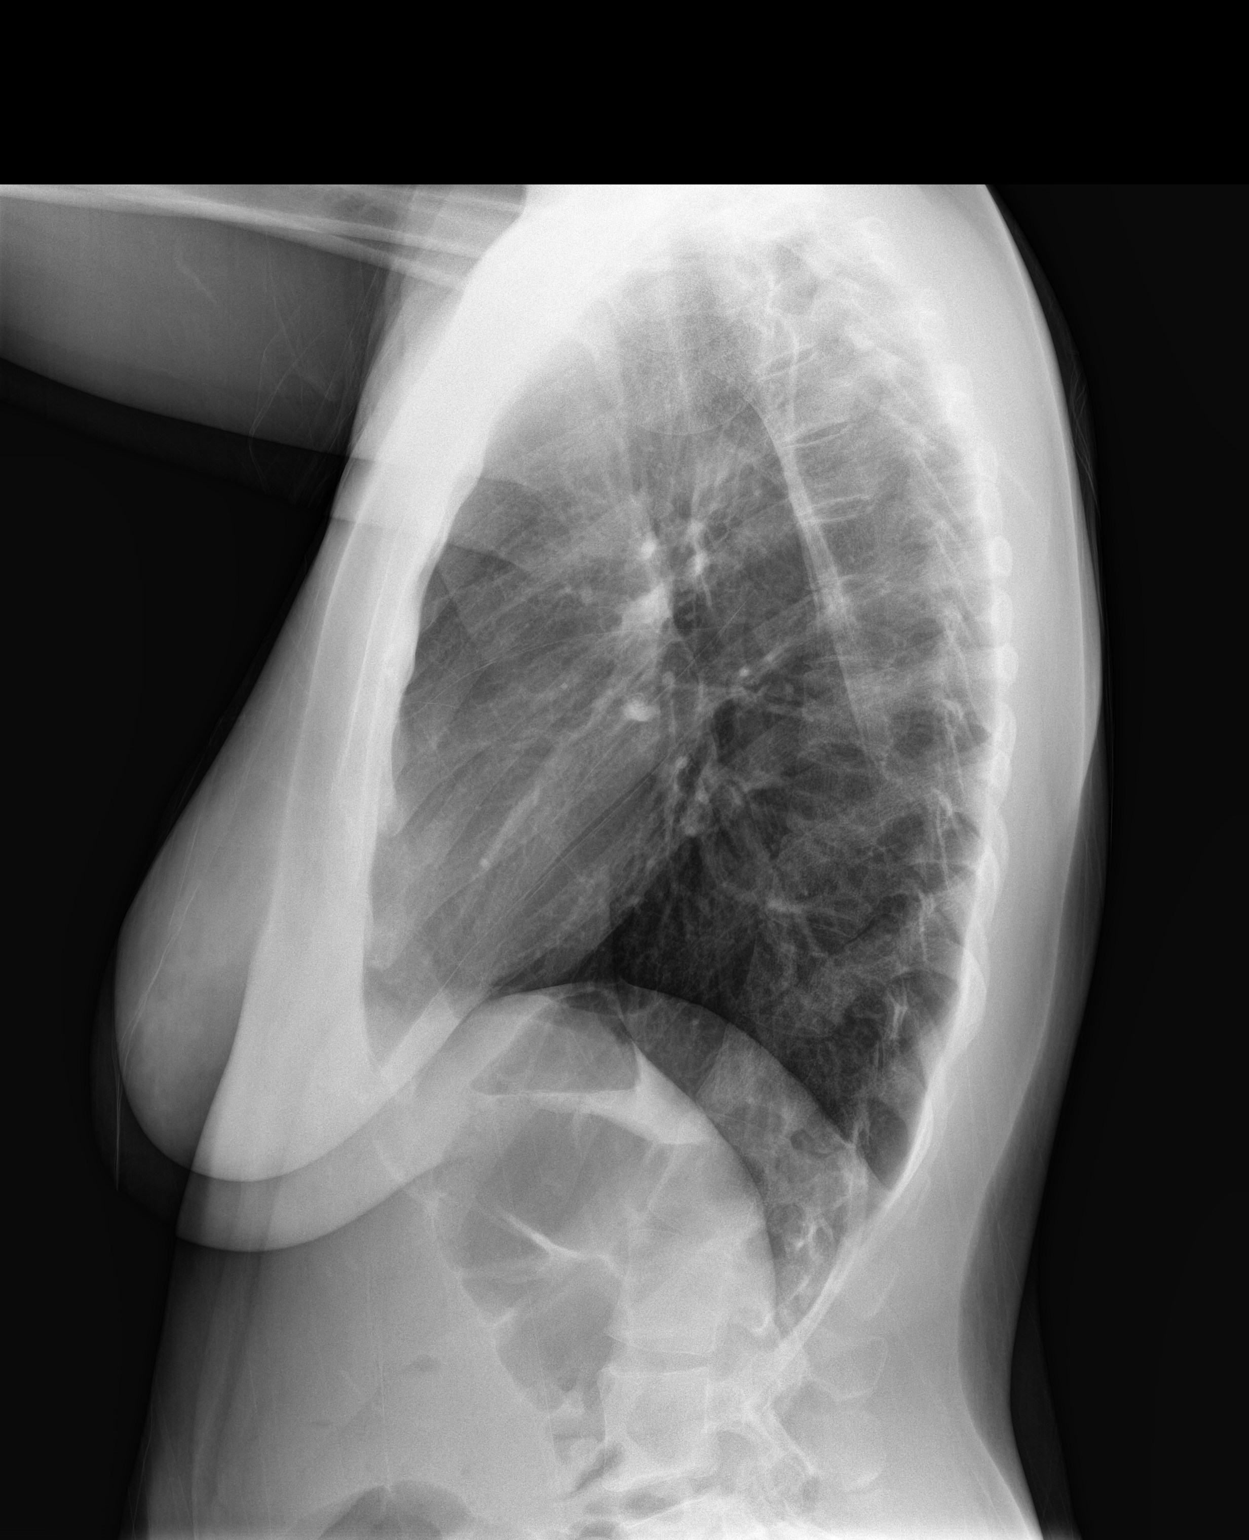

[2 of 2 positions shown; findings below may reference images not displayed]

FINDINGS: Cardiomediastinal silhouette is unremarkable. No acute infiltrate or
pleural effusion. No pulmonary edema. Bony thorax is unremarkable.
IMPRESSION: No active cardiopulmonary disease.

## 2018-09-18 ENCOUNTER — Ambulatory Visit (HOSPITAL_COMMUNITY)
Admission: EM | Admit: 2018-09-18 | Discharge: 2018-09-18 | Disposition: A | Discharge status: Home / Self Care | Payer: Medicaid Other | Attending: Urgent Care | Admitting: Urgent Care

## 2018-09-18 ENCOUNTER — Encounter (HOSPITAL_COMMUNITY): Payer: Self-pay | Admitting: Urgent Care

## 2018-09-18 DIAGNOSIS — R140 Abdominal distension (gaseous): Secondary | ICD-10-CM

## 2018-09-18 DIAGNOSIS — K5900 Constipation, unspecified: Secondary | ICD-10-CM

## 2018-09-18 DIAGNOSIS — R1084 Generalized abdominal pain: Secondary | ICD-10-CM

## 2018-09-18 MED ORDER — DOCUSATE SODIUM 50 MG PO CAPS: 50.0000 mg | ORAL_CAPSULE | Freq: Two times a day (BID) | ORAL | 1 refills | Status: DC

## 2018-09-18 NOTE — ED Triage Notes (Addendum)
Pt present abdominal pain with some constipation. Pt states symptoms started early Sunday morning when she was woke up out her sleep with cramps.  Pt has tried Miralax and probiotics to help with going to the bathroom but nothing seems to help.

## 2018-09-18 NOTE — Discharge Instructions (Addendum)
Please use Miralax or Fleet Enema for moderate to severe constipation. Take this once a day for the next 2-3 days. Please also start Colace (docusate) 50mg  stool softener, twice a day for at least 1 week. If stools become loose, cut down to once a day for another week. If stools remain loose, cut back to 1 pill every other day for a third week. You can stop docusate thereafter and resume as needed for constipation.  To help reduce constipation and promote bowel health: 1. Drink at least 64 ounces of water each day 2. Eat plenty of fiber (fruits, vegetables, whole grains, legumes) 3. Be physically active or exercise including walking, jogging, swimming, yoga, etc. 4. For active constipation use a stool softener (docusate) or an osmotic laxative (like Miralax), or as needed.  Salads - kale, spinach, cabbage, spring mix; use seeds like pumpkin seeds or sunflower seeds; you can also use 1-2 hard boiled eggs. Fruits - avocadoes, berries (blueberries, raspberries, blackberries), apples, oranges, pomegranate, grapefruit Seeds - quinoa, chia seeds; you can also incorporate oatmeal Vegetables - aspargus, cauliflower, broccoli, green beans, brussel spouts, bell peppers; stay away from starchy vegetables like potatoes, carrots, peas

## 2018-09-18 NOTE — ED Provider Notes (Signed)
MRN: [id number] DOB: 06/24/1990  Subjective:   Deanna Warren is a 28 y.o. female presenting for 4 day history of persistent recurrent intermittent bloating, abdominal pressure, mild aching pain. Has longstanding history of constipation. Has had 1 full bowel movement ~2 days ago this week. Uses laxatives daily since Sunday. Diet is very unhealthy by patient's own admission.  Patient reports that she eats out a lot and has been under a lot of stress.  States that her appetite is decreased and is having a lot of difficulties in her life with recently moving.  She feels stable mentally but knows that she has constipation and wants to make sure that we consider an x-ray.  She does have a PCP and has an office visit set up with them.   No current facility-administered medications for this encounter.   Current Outpatient Medications:  Marland Kitchen  Multiple Vitamin (MULTIVITAMIN) capsule, Take 1 capsule by mouth daily., Disp: , Rfl:  .  omeprazole (PRILOSEC) 20 MG capsule, Take 1 capsule (20 mg total) by mouth daily., Disp: 30 capsule, Rfl: 0 .  polyethylene glycol (MIRALAX / GLYCOLAX) packet, Take 17 g by mouth daily., Disp: 24 each, Rfl: 0    No Known Allergies   Past Medical History:  Diagnosis Date  . Depression   . Medical history non-contributory      Past Surgical History:  Procedure Laterality Date  . NO PAST SURGERIES      ROS  Objective:   Vitals: BP 106/62 (BP Location: Left Arm)   Pulse 99   Temp 98.4 F (36.9 C) (Oral)   Resp 16   LMP 09/18/2018   SpO2 97%   Physical Exam Constitutional:      General: She is not in acute distress.    Appearance: Normal appearance. She is well-developed and normal weight. She is not ill-appearing, toxic-appearing or diaphoretic.  HENT:     Head: Normocephalic and atraumatic.     Right Ear: External ear normal.     Left Ear: External ear normal.     Nose: Nose normal.     Mouth/Throat:     Mouth: Mucous membranes are moist.      Pharynx: Oropharynx is clear.  Eyes:     General: No scleral icterus.    Extraocular Movements: Extraocular movements intact.     Pupils: Pupils are equal, round, and reactive to light.  Cardiovascular:     Rate and Rhythm: Normal rate and regular rhythm.     Pulses: Normal pulses.     Heart sounds: Normal heart sounds. No murmur. No friction rub. No gallop.   Pulmonary:     Effort: Pulmonary effort is normal. No respiratory distress.     Breath sounds: Normal breath sounds. No stridor. No wheezing, rhonchi or rales.  Abdominal:     General: Bowel sounds are normal. There is distension (Mild).     Palpations: Abdomen is soft. There is no mass.     Tenderness: There is abdominal tenderness (Generalized throughout). There is no right CVA tenderness, left CVA tenderness, guarding or rebound.  Skin:    General: Skin is warm and dry.     Coloration: Skin is not pale.     Findings: No rash.  Neurological:     General: No focal deficit present.     Mental Status: She is alert and oriented to person, place, and time.  Psychiatric:        Mood and Affect: Mood normal.  Behavior: Behavior normal.        Thought Content: Thought content normal.        Judgment: Judgment normal.    Assessment and Plan :   Constipation, unspecified constipation type  Abdominal bloating  Generalized abdominal pain  Patient reports that she is able to drink and hydrate well, has no vomiting.  She is burping and passing gas.  Counseled extensively on management of chronic idiopathic constipation.  Recommended she make significant dietary modifications to include more hydration, fiber.  Counseled that she needs to stop eating fast food and restaurant foods.  Recommended she use a Fleet enema instead of daily use of laxatives.  Counseled that she can use stool softener instead. Counseled patient on potential for adverse effects with medications prescribed today, patient verbalized understanding. ER and  return-to-clinic precautions discussed, patient verbalized understanding.    Wallis BambergMani, Elsye Mccollister, PA-C 09/18/18 1400

## 2018-10-11 ENCOUNTER — Emergency Department (HOSPITAL_COMMUNITY)
Admission: EM | Admit: 2018-10-11 | Discharge: 2018-10-11 | Disposition: A | Discharge status: Home / Self Care | Payer: Medicaid Other | Attending: Emergency Medicine | Admitting: Emergency Medicine

## 2018-10-11 ENCOUNTER — Encounter (HOSPITAL_COMMUNITY): Payer: Self-pay | Admitting: Emergency Medicine

## 2018-10-11 ENCOUNTER — Other Ambulatory Visit: Payer: Self-pay

## 2018-10-11 DIAGNOSIS — R1013 Epigastric pain: Secondary | ICD-10-CM | POA: Diagnosis not present

## 2018-10-11 DIAGNOSIS — Z79899 Other long term (current) drug therapy: Secondary | ICD-10-CM | POA: Insufficient documentation

## 2018-10-11 DIAGNOSIS — R1111 Vomiting without nausea: Secondary | ICD-10-CM | POA: Insufficient documentation

## 2018-10-11 LAB — CBC WITH DIFFERENTIAL/PLATELET
Abs Immature Granulocytes: 0.01 10*3/uL (ref 0.00–0.07)
Basophils Absolute: 0 10*3/uL (ref 0.0–0.1)
Basophils Relative: 0 %
Eosinophils Absolute: 0 10*3/uL (ref 0.0–0.5)
Eosinophils Relative: 0 %
HCT: 38.8 % (ref 36.0–46.0)
Hemoglobin: 12.9 g/dL (ref 12.0–15.0)
Immature Granulocytes: 0 %
Lymphocytes Relative: 12 %
Lymphs Abs: 0.7 10*3/uL (ref 0.7–4.0)
MCH: 28.5 pg (ref 26.0–34.0)
MCHC: 33.2 g/dL (ref 30.0–36.0)
MCV: 85.7 fL (ref 80.0–100.0)
Monocytes Absolute: 0.2 10*3/uL (ref 0.1–1.0)
Monocytes Relative: 4 %
Neutro Abs: 4.7 10*3/uL (ref 1.7–7.7)
Neutrophils Relative %: 84 %
Platelets: 242 10*3/uL (ref 150–400)
RBC: 4.53 MIL/uL (ref 3.87–5.11)
RDW: 13.5 % (ref 11.5–15.5)
WBC: 5.5 10*3/uL (ref 4.0–10.5)
nRBC: 0 % (ref 0.0–0.2)

## 2018-10-11 LAB — URINALYSIS, ROUTINE W REFLEX MICROSCOPIC
Bacteria, UA: NONE SEEN
Bilirubin Urine: NEGATIVE
Glucose, UA: NEGATIVE mg/dL
Hgb urine dipstick: NEGATIVE
Ketones, ur: 5 mg/dL — AB
Leukocytes,Ua: NEGATIVE
Nitrite: NEGATIVE
Protein, ur: 100 mg/dL — AB
Specific Gravity, Urine: 1.024 (ref 1.005–1.030)
pH: 9 — ABNORMAL HIGH (ref 5.0–8.0)

## 2018-10-11 LAB — COMPREHENSIVE METABOLIC PANEL
ALT: 11 U/L (ref 0–44)
AST: 17 U/L (ref 15–41)
Albumin: 4.7 g/dL (ref 3.5–5.0)
Alkaline Phosphatase: 48 U/L (ref 38–126)
Anion gap: 13 (ref 5–15)
BUN: 10 mg/dL (ref 6–20)
CO2: 21 mmol/L — ABNORMAL LOW (ref 22–32)
Calcium: 9.6 mg/dL (ref 8.9–10.3)
Chloride: 107 mmol/L (ref 98–111)
Creatinine, Ser: 0.78 mg/dL (ref 0.44–1.00)
GFR calc Af Amer: >60 mL/min (ref >60)
GFR calc non Af Amer: >60 mL/min (ref >60)
Glucose, Bld: 98 mg/dL (ref 70–99)
Potassium: 3.8 mmol/L (ref 3.5–5.1)
Sodium: 141 mmol/L (ref 135–145)
Total Bilirubin: 1 mg/dL (ref 0.3–1.2)
Total Protein: 7.6 g/dL (ref 6.5–8.1)

## 2018-10-11 LAB — WET PREP, GENITAL
Sperm: NONE SEEN
Trich, Wet Prep: NONE SEEN
Yeast Wet Prep HPF POC: NONE SEEN

## 2018-10-11 LAB — RAPID URINE DRUG SCREEN, HOSP PERFORMED
Amphetamines: NOT DETECTED
Barbiturates: NOT DETECTED
Benzodiazepines: NOT DETECTED
Cocaine: NOT DETECTED
Opiates: NOT DETECTED
Tetrahydrocannabinol: POSITIVE — AB

## 2018-10-11 LAB — LIPASE, BLOOD: Lipase: 26 U/L (ref 11–51)

## 2018-10-11 LAB — I-STAT BETA HCG BLOOD, ED (MC, WL, AP ONLY): I-stat hCG, quantitative: <5 m[IU]/mL (ref <5)

## 2018-10-11 MED ORDER — ONDANSETRON 4 MG PO TBDP
4.0000 mg | ORAL_TABLET | Freq: Once | ORAL | Status: AC
  Administered 2018-10-11: 4 mg via ORAL
  Filled 2018-10-11: qty 1

## 2018-10-11 MED ORDER — DICYCLOMINE HCL 10 MG PO CAPS
10.0000 mg | ORAL_CAPSULE | Freq: Once | ORAL | Status: AC
  Administered 2018-10-11: 10 mg via ORAL
  Filled 2018-10-11: qty 1

## 2018-10-11 MED ORDER — LIDOCAINE VISCOUS HCL 2 % MT SOLN
15.0000 mL | Freq: Once | OROMUCOSAL | Status: AC
Start: 2018-10-11 — End: 2018-10-11
  Administered 2018-10-11: 16:00:00 15 mL via ORAL
  Filled 2018-10-11: qty 15

## 2018-10-11 MED ORDER — OMEPRAZOLE 20 MG PO CPDR: 20.0000 mg | DELAYED_RELEASE_CAPSULE | Freq: Every day | ORAL | 0 refills | Status: DC

## 2018-10-11 MED ORDER — FAMOTIDINE 20 MG PO TABS
20.0000 mg | ORAL_TABLET | Freq: Once | ORAL | Status: AC
  Administered 2018-10-11: 16:00:00 20 mg via ORAL
  Filled 2018-10-11: qty 1

## 2018-10-11 MED ORDER — DICYCLOMINE HCL 10 MG PO CAPS: 10.0000 mg | ORAL_CAPSULE | Freq: Three times a day (TID) | ORAL | 0 refills | Status: DC | PRN

## 2018-10-11 MED ORDER — ONDANSETRON 4 MG PO TBDP: 4.0000 mg | ORAL_TABLET | ORAL | 0 refills | Status: DC | PRN

## 2018-10-11 MED ORDER — ALUM & MAG HYDROXIDE-SIMETH 200-200-20 MG/5ML PO SUSP
30.0000 mL | Freq: Once | ORAL | Status: AC
  Administered 2018-10-11: 16:00:00 30 mL via ORAL
  Filled 2018-10-11: qty 30

## 2018-10-11 NOTE — ED Triage Notes (Signed)
Pt. Stated, Deanna Warren had stomach pain with N/V since 2016.

## 2018-10-11 NOTE — ED Notes (Signed)
Pt ambulated to restroom with steady gait.

## 2018-10-11 NOTE — Discharge Instructions (Signed)
1.  Take Prilosec daily. 2.  Follow diet for gastritis.  Avoid spicy foods, alcohol, chocolate, mint, caffeine. 3.  You may take Bentyl if needed for cramping type abdominal pain. 4.  Sometimes smoking marijuana can give problems of severe vomiting episodes.  This is actually a fairly common problem.  Try to avoid all marijuana. 5.  Is very important you follow-up with a gastroenterologist or family doctor.  You may need further testing such as upper endoscopy to look for ulcers.

## 2018-10-11 NOTE — ED Provider Notes (Signed)
[facility] EMERGENCY DEPARTMENT Provider Note   CSN: [id number] Arrival date & time: 10/11/18  1335    History   Chief Complaint Chief Complaint  Patient presents with  . Abdominal Pain  . Emesis  . Nausea  . Chills    HPI Deanna Warren is a 28 y.o. female.     HPI Patient reports she had problems with abdominal pain off and on for several years.  She does not have a specific diagnosis.  She reports she is wondered if she has ulcers because that has run in other family members.  She reports that she will intermittently vomit after she eats.  She reports that she does get problems with epigastric pain.  Also at times she will have variable diarrheal illness that then resolves.  He denies blood in the stool.  She denies blood in the emesis.  She reports the emesis was yellow today.  She does not have fevers or chills.  She denies urinary symptoms of pain or burning.  Reports her menstrual cycles are typically pretty heavy initially but she has not had any abnormal vaginal discharge or bleeding.  She reports she has been sexually active a few times recently.  She reports she was recently engaged.  She does not suspect STD at this time.  She has occasionally tried some Tums.  She has also tried MiraLAX.  She has not consistently taken any antacid or PPI. Past Medical History:  Diagnosis Date  . Depression   . Medical history non-contributory     Patient Active Problem List   Diagnosis Date Noted  . NSVD (normal spontaneous vaginal delivery) 02/24/2015    Past Surgical History:  Procedure Laterality Date  . NO PAST SURGERIES       OB History    Gravida  1   Para  1   Term  1   Preterm      AB      Living  1     SAB      TAB      Ectopic      Multiple  0   Live Births  1            Home Medications    Prior to Admission medications   Medication Sig Start Date End Date Taking? Authorizing Provider  dicyclomine (BENTYL)  10 MG capsule Take 1 capsule (10 mg total) by mouth 3 (three) times daily as needed for spasms. 10/11/18   Arby Barrette, MD  docusate sodium (COLACE) 50 MG capsule Take 1 capsule (50 mg total) by mouth 2 (two) times daily. 09/18/18   Wallis Bamberg, PA-C  Multiple Vitamin (MULTIVITAMIN) capsule Take 1 capsule by mouth daily.    [provider]  omeprazole (PRILOSEC) 20 MG capsule Take 1 capsule (20 mg total) by mouth daily. 11/29/17   Wurst, [place], PA-C  omeprazole (PRILOSEC) 20 MG capsule Take 1 capsule (20 mg total) by mouth daily. 10/11/18   Arby Barrette, MD  ondansetron (ZOFRAN ODT) 4 MG disintegrating tablet Take 1 tablet (4 mg total) by mouth every 4 (four) hours as needed for nausea or vomiting. 10/11/18   Arby Barrette, MD  polyethylene glycol (MIRALAX / GLYCOLAX) packet Take 17 g by mouth daily. 11/29/17   Rennis Harding, PA-C    Family History Family History  Problem Relation Age of Onset  . Hyperlipidemia Father     Social History Social History   Tobacco Use  . Smoking status:  Never Smoker  . Smokeless tobacco: Never Used  Substance Use Topics  . Alcohol use: Yes  . Drug use: Yes    Types: Marijuana     Allergies   Patient has no known allergies.   Review of Systems Review of Systems 10 Systems reviewed and are negative for acute change except as noted in the HPI.   Physical Exam Updated Vital Signs BP 126/77 (BP Location: Right Arm)   Pulse (!) 108   Temp 98.1 F (36.7 C) (Oral)   Resp 18   Ht 5' (1.524 m)   Wt 45.4 kg   LMP 09/18/2018   SpO2 100%   BMI 19.53 kg/m   Physical Exam Constitutional:      Comments: Patient is alert and nontoxic.  She is in no acute distress.  Ambulatory in the emergency department without difficulty.  HENT:     Head: Normocephalic and atraumatic.     Mouth/Throat:     Mouth: Mucous membranes are moist.     Pharynx: Oropharynx is clear.  Eyes:     Extraocular Movements: Extraocular movements intact.      Conjunctiva/sclera: Conjunctivae normal.  Neck:     Musculoskeletal: Neck supple.  Cardiovascular:     Rate and Rhythm: Normal rate and regular rhythm.  Pulmonary:     Effort: Pulmonary effort is normal.     Breath sounds: Normal breath sounds.  Abdominal:     Palpations: Abdomen is soft.     Comments: Mild epigastric tenderness to palpation. No  Guarding.  No significant tenderness lower abdomen.  Genitourinary:    Comments: Pelvic: Normal external female genitalia.  Speculum cervix normal with slight friability but no bleeding.  No discharge from the cervix.  Small amount of whitish-yellow discharge in the vaginal vault.  No cervical motion tenderness.  No uterine tenderness.  Adnexa no tenderness or fullness. Musculoskeletal: Normal range of motion.        General: No swelling or tenderness.     Right lower leg: No edema.     Left lower leg: No edema.  Skin:    General: Skin is warm and dry.  Neurological:     General: No focal deficit present.     Mental Status: She is oriented to person, place, and time.     Coordination: Coordination normal.  Psychiatric:        Mood and Affect: Mood normal.      ED Treatments / Results  Labs (all labs ordered are listed, but only abnormal results are displayed) Labs Reviewed  WET PREP, GENITAL - Abnormal; Notable for the following components:      Result Value   Clue Cells Wet Prep HPF POC PRESENT (*)    WBC, Wet Prep HPF POC MANY (*)    All other components within normal limits  COMPREHENSIVE METABOLIC PANEL - Abnormal; Notable for the following components:   CO2 21 (*)    All other components within normal limits  URINALYSIS, ROUTINE W REFLEX MICROSCOPIC - Abnormal; Notable for the following components:   pH 9.0 (*)    Ketones, ur 5 (*)    Protein, ur 100 (*)    All other components within normal limits  RAPID URINE DRUG SCREEN, HOSP PERFORMED - Abnormal; Notable for the following components:   Tetrahydrocannabinol POSITIVE (*)     All other components within normal limits  LIPASE, BLOOD  CBC WITH DIFFERENTIAL/PLATELET  RPR  HIV ANTIBODY (ROUTINE TESTING W REFLEX)  I-STAT BETA HCG  BLOOD, ED ([facility], [facility], [facility] ONLY)  GC/CHLAMYDIA PROBE AMP ([place]) NOT AT [facility]    EKG None  Radiology No results found.  Procedures Procedures (including critical care time)  Medications Ordered in ED Medications  famotidine (PEPCID) tablet 20 mg (20 mg Oral Given 10/11/18 1605)  alum & mag hydroxide-simeth (MAALOX/MYLANTA) 200-200-20 MG/5ML suspension 30 mL (30 mLs Oral Given 10/11/18 1605)    And  lidocaine (XYLOCAINE) 2 % viscous mouth solution 15 mL (15 mLs Oral Given 10/11/18 1605)  ondansetron (ZOFRAN-ODT) disintegrating tablet 4 mg (4 mg Oral Given 10/11/18 1923)  dicyclomine (BENTYL) capsule 10 mg (10 mg Oral Given 10/11/18 1923)     Initial Impression / Assessment and Plan / ED Course  I have reviewed the triage vital signs and the nursing notes.  Pertinent labs & imaging results that were available during my care of the patient were reviewed by me and considered in my medical decision making (see chart for details).       Patient has had very longstanding episodes of abdominal pain.  Today exam does not suggest surgical etiology.  Pain is predominantly epigastric and often occurs in the form of vomiting after eating.  Patient does have family history that he reports is strong for ulcers.  Is a consideration but patient does not have any blood in her emesis per her report.  Patient will be started on a daily PPI.  She also does smoke marijuana.  She is counseled about the possibility of cannabinoid hyperemesis.  Patient is alert and clinically well in appearance.  She is thin but well-nourished well-developed.  He is aware of the need to start treatment as outlined and schedule a follow-up plan for further evaluation.  Final Clinical Impressions(s) / ED Diagnoses   Final diagnoses:  Epigastric pain  Non-intractable  vomiting without nausea, unspecified vomiting type    ED Discharge Orders         Ordered    omeprazole (PRILOSEC) 20 MG capsule  Daily     10/11/18 1904    ondansetron (ZOFRAN ODT) 4 MG disintegrating tablet  Every 4 hours PRN     10/11/18 1904    dicyclomine (BENTYL) 10 MG capsule  3 times daily PRN     10/11/18 1907           Arby BarrettePfeiffer, Shakisha Abend, MD 10/11/18 1943

## 2018-10-11 NOTE — ED Notes (Signed)
PT states understanding of care given, follow up care, and medication prescribed. PT ambulated from ED to car with a steady gait. 

## 2018-10-11 NOTE — ED Notes (Signed)
Pt stated that her pain has returned.

## 2018-10-12 LAB — HIV ANTIBODY (ROUTINE TESTING W REFLEX): HIV Screen 4th Generation wRfx: NONREACTIVE

## 2018-10-12 LAB — RPR: RPR Ser Ql: NONREACTIVE

## 2018-10-13 LAB — GC/CHLAMYDIA PROBE AMP (~~LOC~~) NOT AT ARMC
Chlamydia: NEGATIVE
Neisseria Gonorrhea: NEGATIVE

## 2019-04-27 ENCOUNTER — Other Ambulatory Visit: Payer: Self-pay | Admitting: Nurse Practitioner

## 2019-04-27 ENCOUNTER — Ambulatory Visit
Admission: RE | Admit: 2019-04-27 | Discharge: 2019-04-27 | Disposition: A | Discharge status: Home / Self Care | Payer: Medicaid Other | Source: Ambulatory Visit | Attending: Nurse Practitioner | Admitting: Nurse Practitioner

## 2019-04-27 ENCOUNTER — Other Ambulatory Visit: Payer: Self-pay

## 2019-04-27 DIAGNOSIS — R61 Generalized hyperhidrosis: Secondary | ICD-10-CM

## 2019-04-27 DIAGNOSIS — R634 Abnormal weight loss: Secondary | ICD-10-CM

## 2019-04-27 DIAGNOSIS — R7611 Nonspecific reaction to tuberculin skin test without active tuberculosis: Secondary | ICD-10-CM

## 2019-06-12 ENCOUNTER — Other Ambulatory Visit: Payer: Self-pay

## 2019-06-12 ENCOUNTER — Encounter (HOSPITAL_COMMUNITY): Payer: Self-pay | Admitting: Emergency Medicine

## 2019-06-12 ENCOUNTER — Ambulatory Visit (HOSPITAL_COMMUNITY)
Admission: EM | Admit: 2019-06-12 | Discharge: 2019-06-12 | Disposition: A | Discharge status: Home / Self Care | Payer: Medicaid Other | Attending: Family Medicine | Admitting: Family Medicine

## 2019-06-12 DIAGNOSIS — Z20822 Contact with and (suspected) exposure to covid-19: Secondary | ICD-10-CM | POA: Diagnosis not present

## 2019-06-12 NOTE — Discharge Instructions (Signed)
Please quarantine until you know your results.  We will call you.

## 2019-06-12 NOTE — ED Provider Notes (Signed)
[facility]    CSN: [id number] Arrival date & time: 06/12/19  0846      History   Chief Complaint Chief Complaint  Patient presents with  . URI    COVID exposure    HPI Deanna Warren is a 29 y.o. female.   She has concern with possible Covid exposure with her fianc.  He had some myalgias but no discernible fever.  She has been symptom-free.  Denies any other exposures.  HPI  Past Medical History:  Diagnosis Date  . Depression   . Medical history non-contributory     Patient Active Problem List   Diagnosis Date Noted  . NSVD (normal spontaneous vaginal delivery) 02/24/2015    Past Surgical History:  Procedure Laterality Date  . NO PAST SURGERIES      OB History    Gravida  1   Para  1   Term  1   Preterm      AB      Living  1     SAB      TAB      Ectopic      Multiple  0   Live Births  1            Home Medications    Prior to Admission medications   Medication Sig Start Date End Date Taking? Authorizing Provider  dicyclomine (BENTYL) 10 MG capsule Take 1 capsule (10 mg total) by mouth 3 (three) times daily as needed for spasms. 10/11/18   Arby Barrette, MD  docusate sodium (COLACE) 50 MG capsule Take 1 capsule (50 mg total) by mouth 2 (two) times daily. 09/18/18   Wallis Bamberg, PA-C  Multiple Vitamin (MULTIVITAMIN) capsule Take 1 capsule by mouth daily.    [provider]  omeprazole (PRILOSEC) 20 MG capsule Take 1 capsule (20 mg total) by mouth daily. 11/29/17   Wurst, [place], PA-C  omeprazole (PRILOSEC) 20 MG capsule Take 1 capsule (20 mg total) by mouth daily. 10/11/18   Arby Barrette, MD  ondansetron (ZOFRAN ODT) 4 MG disintegrating tablet Take 1 tablet (4 mg total) by mouth every 4 (four) hours as needed for nausea or vomiting. 10/11/18   Arby Barrette, MD  polyethylene glycol (MIRALAX / GLYCOLAX) packet Take 17 g by mouth daily. 11/29/17   Rennis Harding, PA-C    Family History Family History    Problem Relation Age of Onset  . Hyperlipidemia Father     Social History Social History   Tobacco Use  . Smoking status: Never Smoker  . Smokeless tobacco: Never Used  Substance Use Topics  . Alcohol use: Yes  . Drug use: Yes    Types: Marijuana     Allergies   Patient has no known allergies.   Review of Systems Review of Systems  Constitutional: Negative for fever.  HENT: Negative for congestion.   Respiratory: Negative for cough.   Cardiovascular: Negative for chest pain.  Gastrointestinal: Negative for abdominal pain.  Musculoskeletal: Negative for back pain.  Skin: Negative for color change.  Neurological: Negative for weakness.  Hematological: Negative for adenopathy.     Physical Exam Triage Vital Signs ED Triage Vitals [06/12/19 0858]  Enc Vitals Group     BP      Pulse      Resp      Temp      Temp src      SpO2      Weight      Height  Head Circumference      Peak Flow      Pain Score 0     Pain Loc      Pain Edu?      Excl. in [place]?    No data found.  Updated Vital Signs BP (!) 98/59 (BP Location: Left Arm)   Pulse 70   Temp 98.2 F (36.8 C) (Oral)   Resp 14   LMP 06/12/2019   SpO2 100%   Breastfeeding No   Visual Acuity Right Eye Distance:   Left Eye Distance:   Bilateral Distance:    Right Eye Near:   Left Eye Near:    Bilateral Near:     Physical Exam Gen: NAD, alert, cooperative with exam, well-appearing  Resp: no accessory muscle use, non-labored, clear to auscultation bilaterally, no crackles or wheezes    UC Treatments / Results  Labs (all labs ordered are listed, but only abnormal results are displayed) Labs Reviewed  NOVEL CORONAVIRUS, NAA (HOSP ORDER, SEND-OUT TO REF LAB; TAT 18-24 HRS)    EKG   Radiology No results found.  Procedures Procedures (including critical care time)  Medications Ordered in UC Medications - No data to display  Initial Impression / Assessment and Plan / UC Course  I  have reviewed the triage vital signs and the nursing notes.  Pertinent labs & imaging results that were available during my care of the patient were reviewed by me and considered in my medical decision making (see chart for details).     Deanna Warren is a 29 yo F that is presenting with possible exposure to COVID. Test taken. Counseled on supportive care.   Final Clinical Impressions(s) / UC Diagnoses   Final diagnoses:  Close exposure to COVID-19 virus     Discharge Instructions     Please quarantine until you know your results.  We will call you.     ED Prescriptions    None     PDMP not reviewed this encounter.   Rosemarie Ax, MD 06/12/19 [phone]

## 2019-06-12 NOTE — ED Triage Notes (Signed)
Pt reports husband tested positive for COVID  Would like to be tested...Deanna Warren pt is asymptomatic.   A&O x4... NAD.Deanna Warren. ambulatory

## 2019-06-14 LAB — NOVEL CORONAVIRUS, NAA (HOSP ORDER, SEND-OUT TO REF LAB; TAT 18-24 HRS): SARS-CoV-2, NAA: NOT DETECTED

## 2019-10-29 ENCOUNTER — Ambulatory Visit (INDEPENDENT_AMBULATORY_CARE_PROVIDER_SITE_OTHER): Payer: Medicaid Other | Admitting: Internal Medicine

## 2019-10-29 ENCOUNTER — Other Ambulatory Visit: Payer: Self-pay

## 2019-10-29 ENCOUNTER — Encounter: Payer: Self-pay | Admitting: Internal Medicine

## 2019-10-29 DIAGNOSIS — Z227 Latent tuberculosis: Secondary | ICD-10-CM | POA: Diagnosis not present

## 2019-10-29 HISTORY — DX: Latent tuberculosis: Z22.7

## 2019-10-29 MED ORDER — RIFAMPIN 300 MG PO CAPS: 600.0000 mg | ORAL_CAPSULE | Freq: Every day | ORAL | 3 refills | Status: DC

## 2019-10-29 NOTE — Progress Notes (Signed)
[facility] for Infectious [facility]      Reason for Consult: latent Tb    Referring Physician: Morrell Riddle, NP    Patient ID: Deanna Warren, female    DOB: [birth year], 29 y.o.   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  She is unsure of how many millimeters.  She was never offered treatement or evaluated for it.  She has had some issues with night sweats but no sob, no cough, no fever.  Had a recent CXR that was clear.  Has tested negative for HIV.  Interested in treatment.  She reports she is not taking omeprazole.    Past Medical History:  Diagnosis Date  . Depression   . Medical history non-contributory     Prior to Admission medications   Medication Sig Start Date End Date Taking? Authorizing Provider  dicyclomine (BENTYL) 10 MG capsule Take 1 capsule (10 mg total) by mouth 3 (three) times daily as needed for spasms. 10/11/18   Charlesetta Shanks, MD  docusate sodium (COLACE) 50 MG capsule Take 1 capsule (50 mg total) by mouth 2 (two) times daily. 09/18/18   Jaynee Eagles, PA-C  Multiple Vitamin (MULTIVITAMIN) capsule Take 1 capsule by mouth daily.    [provider]  omeprazole (PRILOSEC) 20 MG capsule Take 1 capsule (20 mg total) by mouth daily. 11/29/17   Wurst, [place], PA-C  omeprazole (PRILOSEC) 20 MG capsule Take 1 capsule (20 mg total) by mouth daily. 10/11/18   Charlesetta Shanks, MD  ondansetron (ZOFRAN ODT) 4 MG disintegrating tablet Take 1 tablet (4 mg total) by mouth every 4 (four) hours as needed for nausea or vomiting. 10/11/18   Charlesetta Shanks, MD  polyethylene glycol (MIRALAX / GLYCOLAX) packet Take 17 g by mouth daily. 11/29/17   Wurst, [place], PA-C    No Known Allergies  Social History   Tobacco Use  . Smoking status: Never Smoker  . Smokeless tobacco: Never Used  Substance Use Topics  . Alcohol use: Yes  . Drug use: Yes    Frequency: 7.0 times per week    Types: Marijuana    Family  History  Problem Relation Age of Onset  . Hyperlipidemia Father     Review of Systems  Constitutional: negative for fevers, chills and anorexia Respiratory: negative for cough, sputum, hemoptysis, pleurisy/chest pain or dyspnea on exertion Gastrointestinal: negative for diarrhea All other systems reviewed and are negative    Constitutional: in no apparent distress There were no vitals filed for this visit. EYES: anicteric Cardiovascular: Cor RRR Respiratory: clear Musculoskeletal: peripheral pulses normal, no pedal edema, no clubbing or cyanosis Skin: negatives: no rash Neuro: non-focal  Labs: Lab Results  Component Value Date   WBC 5.5 10/11/2018   HGB 12.9 10/11/2018   HCT 38.8 10/11/2018   MCV 85.7 10/11/2018   PLT 242 10/11/2018    Lab Results  Component Value Date   CREATININE 0.78 10/11/2018   BUN 10 10/11/2018   NA 141 10/11/2018   K 3.8 10/11/2018   CL 107 10/11/2018   CO2 21 (L) 10/11/2018    Lab Results  Component Value Date   ALT 11 10/11/2018   AST 17 10/11/2018   ALKPHOS 48 10/11/2018   BILITOT 1.0 10/11/2018     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  She does have night sweats but not with any of  the above symptoms so not c/w active Tb.  I discussed treatment options and will give rifampin for 4 months.  I discussed that it will always be positive and Tb screening will be with symptoms and CXR.  Treatment is once.  Avoid alcohol.    Plan: 1) hepatic function panel 2) rifampin 600 mg daily for 4 months rtc 4 weeks for repeat hepatic fp and check tolerance.

## 2019-10-30 LAB — HEPATIC FUNCTION PANEL
AG Ratio: 2.3 (calc) (ref 1.0–2.5)
ALT: 8 U/L (ref 6–29)
AST: 14 U/L (ref 10–30)
Albumin: 4.4 g/dL (ref 3.6–5.1)
Alkaline phosphatase (APISO): 42 U/L (ref 31–125)
Bilirubin, Direct: 0.1 mg/dL (ref 0.0–0.2)
Globulin: 1.9 g/dL (calc) (ref 1.9–3.7)
Indirect Bilirubin: 0.6 mg/dL (calc) (ref 0.2–1.2)
Total Bilirubin: 0.7 mg/dL (ref 0.2–1.2)
Total Protein: 6.3 g/dL (ref 6.1–8.1)

## 2019-11-30 ENCOUNTER — Ambulatory Visit: Payer: Medicaid Other | Admitting: Internal Medicine

## 2020-01-05 ENCOUNTER — Ambulatory Visit: Payer: Medicaid Other | Admitting: Internal Medicine

## 2020-01-06 ENCOUNTER — Emergency Department (HOSPITAL_COMMUNITY)
Admission: EM | Admit: 2020-01-06 | Discharge: 2020-01-07 | Disposition: A | Discharge status: Home / Self Care | Payer: Medicaid Other | Attending: Emergency Medicine | Admitting: Emergency Medicine

## 2020-01-06 ENCOUNTER — Other Ambulatory Visit: Payer: Self-pay

## 2020-01-06 ENCOUNTER — Emergency Department (HOSPITAL_COMMUNITY): Payer: Medicaid Other

## 2020-01-06 ENCOUNTER — Encounter (HOSPITAL_COMMUNITY): Payer: Self-pay

## 2020-01-06 DIAGNOSIS — K5900 Constipation, unspecified: Secondary | ICD-10-CM | POA: Insufficient documentation

## 2020-01-06 DIAGNOSIS — R112 Nausea with vomiting, unspecified: Secondary | ICD-10-CM | POA: Insufficient documentation

## 2020-01-06 DIAGNOSIS — Z20822 Contact with and (suspected) exposure to covid-19: Secondary | ICD-10-CM | POA: Diagnosis not present

## 2020-01-06 DIAGNOSIS — R531 Weakness: Secondary | ICD-10-CM | POA: Diagnosis present

## 2020-01-06 DIAGNOSIS — R630 Anorexia: Secondary | ICD-10-CM | POA: Diagnosis not present

## 2020-01-06 DIAGNOSIS — F1290 Cannabis use, unspecified, uncomplicated: Secondary | ICD-10-CM | POA: Insufficient documentation

## 2020-01-06 DIAGNOSIS — R109 Unspecified abdominal pain: Secondary | ICD-10-CM | POA: Insufficient documentation

## 2020-01-06 LAB — CBC
HCT: 34.5 % — ABNORMAL LOW (ref 36.0–46.0)
Hemoglobin: 11 g/dL — ABNORMAL LOW (ref 12.0–15.0)
MCH: 27.8 pg (ref 26.0–34.0)
MCHC: 31.9 g/dL (ref 30.0–36.0)
MCV: 87.1 fL (ref 80.0–100.0)
Platelets: 227 10*3/uL (ref 150–400)
RBC: 3.96 MIL/uL (ref 3.87–5.11)
RDW: 13.2 % (ref 11.5–15.5)
WBC: 6 10*3/uL (ref 4.0–10.5)
nRBC: 0 % (ref 0.0–0.2)

## 2020-01-06 LAB — COMPREHENSIVE METABOLIC PANEL
ALT: 10 U/L (ref 0–44)
AST: 19 U/L (ref 15–41)
Albumin: 4.4 g/dL (ref 3.5–5.0)
Alkaline Phosphatase: 44 U/L (ref 38–126)
Anion gap: 13 (ref 5–15)
BUN: 10 mg/dL (ref 6–20)
CO2: 21 mmol/L — ABNORMAL LOW (ref 22–32)
Calcium: 9.1 mg/dL (ref 8.9–10.3)
Chloride: 103 mmol/L (ref 98–111)
Creatinine, Ser: 0.87 mg/dL (ref 0.44–1.00)
GFR calc Af Amer: >60 mL/min (ref >60)
GFR calc non Af Amer: >60 mL/min (ref >60)
Glucose, Bld: 114 mg/dL — ABNORMAL HIGH (ref 70–99)
Potassium: 3.4 mmol/L — ABNORMAL LOW (ref 3.5–5.1)
Sodium: 137 mmol/L (ref 135–145)
Total Bilirubin: 0.5 mg/dL (ref 0.3–1.2)
Total Protein: 7 g/dL (ref 6.5–8.1)

## 2020-01-06 LAB — URINALYSIS, ROUTINE W REFLEX MICROSCOPIC
Bilirubin Urine: NEGATIVE
Glucose, UA: NEGATIVE mg/dL
Hgb urine dipstick: NEGATIVE
Ketones, ur: 20 mg/dL — AB
Leukocytes,Ua: NEGATIVE
Nitrite: NEGATIVE
Protein, ur: NEGATIVE mg/dL
Specific Gravity, Urine: 1.02 (ref 1.005–1.030)
pH: 9 — ABNORMAL HIGH (ref 5.0–8.0)

## 2020-01-06 LAB — I-STAT BETA HCG BLOOD, ED (MC, WL, AP ONLY): I-stat hCG, quantitative: <5 m[IU]/mL (ref <5)

## 2020-01-06 LAB — LIPASE, BLOOD: Lipase: 22 U/L (ref 11–51)

## 2020-01-06 NOTE — ED Triage Notes (Signed)
Pt has multiple complaints: generalized weakness, sob, emesis, no appetite, intermittent abd pain, no fever. Resp e.u in triage. Pt has not been vaccinated for COVID.

## 2020-01-07 LAB — SARS CORONAVIRUS 2 BY RT PCR (HOSPITAL ORDER, PERFORMED IN ~~LOC~~ HOSPITAL LAB): SARS Coronavirus 2: NEGATIVE

## 2020-01-07 NOTE — Discharge Instructions (Signed)
Please read and follow all provided instructions.  Your diagnoses today include:  1. Non-intractable vomiting with nausea, unspecified vomiting type   2. Decreased appetite     Tests performed today include:  Blood counts and electrolytes - slightly low red blood cell count  Blood tests to check liver and kidney function  Blood tests to check pancreas function  Urine test to look for infection and pregnancy (in women)  Vital signs. See below for your results today.   Medications prescribed:   None  Take any prescribed medications only as directed.  Home care instructions:   Follow any educational materials contained in this packet.  Follow-up instructions: Please follow-up with the gastroenterologist for further evaluation of your symptoms.    Return instructions:  SEEK IMMEDIATE MEDICAL ATTENTION IF:  The pain does not go away or becomes severe   A temperature above 101F develops   Repeated vomiting occurs (multiple episodes)   The pain becomes localized to portions of the abdomen. The right side could possibly be appendicitis. In an adult, the left lower portion of the abdomen could be colitis or diverticulitis.   Blood is being passed in stools or vomit (bright red or black tarry stools)   You develop chest pain, difficulty breathing, dizziness or fainting, or become confused, poorly responsive, or inconsolable (young children)  If you have any other emergent concerns regarding your health  Additional Information: Abdominal (belly) pain can be caused by many things. Your caregiver performed an examination and possibly ordered blood/urine tests and imaging (CT scan, x-rays, ultrasound). Many cases can be observed and treated at home after initial evaluation in the emergency department. Even though you are being discharged home, abdominal pain can be unpredictable. Therefore, you need a repeated exam if your pain does not resolve, returns, or worsens. Most patients  with abdominal pain don't have to be admitted to the hospital or have surgery, but serious problems like appendicitis and gallbladder attacks can start out as nonspecific pain. Many abdominal conditions cannot be diagnosed in one visit, so follow-up evaluations are very important.  Your vital signs today were: BP 113/68 (BP Location: Right Arm)   Pulse 69   Temp 98 F (36.7 C) (Oral)   Resp 18   Ht 5' (1.524 m)   Wt 45.4 kg   LMP 01/06/2020   SpO2 100%   BMI 19.53 kg/m  If your blood pressure (bp) was elevated above 135/85 this visit, please have this repeated by your doctor within one month. --------------

## 2020-01-07 NOTE — ED Notes (Signed)
Pt was able to eat and drink. Pt denies any nausea.

## 2020-01-07 NOTE — ED Notes (Signed)
Pt given gingerale for PO challenge 

## 2020-01-07 NOTE — ED Provider Notes (Signed)
[facility] EMERGENCY DEPARTMENT Provider Note   CSN: [id number] Arrival date & time: 01/06/20  2153     History Chief Complaint  Patient presents with  . Weakness  . Emesis    Deanna Warren is a 29 y.o. female.  Patient with no past surgical history presents for chronic nausea and vomiting, poor appetite.  Patient states that she has symptoms ongoing "for years".  She states that she has been told that she has IBS.  She has been placed on medications for this in the past, however she has been reluctant to take these because she does not feel that she has had an appropriate work-up.  She states that she has never seen a gastroenterologist or had an endoscopy.  She states that over the past week she has had increase in nausea and more frequent episodes of vomiting after eating.  She has generalized abdominal pain with vomiting.  She has generalized weakness related to decreased appetite.  Sometimes she will try to drink protein shakes but overall cannot keep down much.  She denies fevers or chills, URI symptoms, chest pain, shortness of breath.  No urine symptoms.  She states that she has frequent problems with constipation and takes over-the-counter laxatives for this.  No blood reported in the stool.        Past Medical History:  Diagnosis Date  . Depression   . Medical history non-contributory     Patient Active Problem List   Diagnosis Date Noted  . 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 NSVD (normal spontaneous vaginal delivery) 02/24/2015    Past Surgical History:  Procedure Laterality Date  . NO PAST SURGERIES       OB History    Gravida  1   Para  1   Term  1   Preterm      AB      Living  1     SAB      TAB      Ectopic      Multiple  0   Live Births  1           Family History  Problem Relation Age of Onset  . Hyperlipidemia Father     Social History   Tobacco Use  . Smoking status: Never Smoker  . Smokeless  tobacco: Never Used  Substance Use Topics  . Alcohol use: Yes  . Drug use: Yes    Frequency: 7.0 times per week    Types: Marijuana    Home Medications Prior to Admission medications   Medication Sig Start Date End Date Taking? Authorizing Provider  dicyclomine (BENTYL) 10 MG capsule Take 1 capsule (10 mg total) by mouth 3 (three) times daily as needed for spasms. Patient not taking: Reported on 01/06/2020 10/11/18   Arby Barrette, MD  docusate sodium (COLACE) 50 MG capsule Take 1 capsule (50 mg total) by mouth 2 (two) times daily. Patient not taking: Reported on 01/06/2020 09/18/18   Wallis Bamberg, PA-C  ondansetron (ZOFRAN ODT) 4 MG disintegrating tablet Take 1 tablet (4 mg total) by mouth every 4 (four) hours as needed for nausea or vomiting. Patient not taking: Reported on 01/06/2020 10/11/18   Arby Barrette, MD  polyethylene glycol (MIRALAX / GLYCOLAX) packet Take 17 g by mouth daily. Patient not taking: Reported on 01/06/2020 11/29/17   Alvino Chapel, [place], PA-C  rifampin (RIFADIN) 300 MG capsule Take 2 capsules (600 mg total) by mouth daily. Patient not taking: Reported on  01/06/2020 10/29/19   Gardiner Barefoot, MD    Allergies    Patient has no known allergies.  Review of Systems   Review of Systems  Constitutional: Positive for appetite change. Negative for chills and fever.  HENT: Negative for rhinorrhea and sore throat.   Eyes: Negative for redness.  Respiratory: Negative for cough and shortness of breath.   Cardiovascular: Negative for chest pain.  Gastrointestinal: Positive for abdominal pain, constipation, nausea and vomiting. Negative for diarrhea.  Genitourinary: Negative for dysuria, frequency, hematuria and urgency.  Musculoskeletal: Negative for myalgias.  Skin: Negative for rash.  Neurological: Positive for weakness. Negative for headaches.    Physical Exam Updated Vital Signs BP (!) 112/52   Pulse 71   Temp 98.2 F (36.8 C) (Oral)   Resp 18   Ht 5' (1.524 m)   Wt  45.4 kg   LMP 01/06/2020   SpO2 100%   BMI 19.53 kg/m   Physical Exam Vitals and nursing note reviewed.  Constitutional:      General: She is not in acute distress.    Appearance: She is well-developed.  HENT:     Head: Normocephalic and atraumatic.     Right Ear: External ear normal.     Left Ear: External ear normal.     Nose: Nose normal.     Mouth/Throat:     Mouth: Mucous membranes are moist.  Eyes:     Conjunctiva/sclera: Conjunctivae normal.  Cardiovascular:     Rate and Rhythm: Normal rate and regular rhythm.     Heart sounds: No murmur heard.   Pulmonary:     Effort: No respiratory distress.     Breath sounds: No wheezing, rhonchi or rales.  Abdominal:     Palpations: Abdomen is soft.     Tenderness: There is no abdominal tenderness. There is no guarding or rebound.  Musculoskeletal:     Cervical back: Normal range of motion and neck supple.     Right lower leg: No edema.     Left lower leg: No edema.  Skin:    General: Skin is warm and dry.     Findings: No rash.  Neurological:     General: No focal deficit present.     Mental Status: She is alert. Mental status is at baseline.     Motor: No weakness.  Psychiatric:        Mood and Affect: Mood normal.     ED Results / Procedures / Treatments   Labs (all labs ordered are listed, but only abnormal results are displayed) Labs Reviewed  COMPREHENSIVE METABOLIC PANEL - Abnormal; Notable for the following components:      Result Value   Potassium 3.4 (*)    CO2 21 (*)    Glucose, Bld 114 (*)    All other components within normal limits  CBC - Abnormal; Notable for the following components:   Hemoglobin 11.0 (*)    HCT 34.5 (*)    All other components within normal limits  URINALYSIS, ROUTINE W REFLEX MICROSCOPIC - Abnormal; Notable for the following components:   pH 9.0 (*)    Ketones, ur 20 (*)    All other components within normal limits  SARS CORONAVIRUS 2 BY RT PCR (HOSPITAL ORDER, PERFORMED IN  [facility])  LIPASE, BLOOD  I-STAT BETA HCG BLOOD, ED ([facility], [facility], [facility] ONLY)    EKG EKG Interpretation  Date/Time:  Wednesday January 06 2020 22:28:07 EDT Ventricular Rate:  89  PR Interval:  198 QRS Duration: 86 QT Interval:  370 QTC Calculation: 450 R Axis:   81 Text Interpretation: Normal sinus rhythm Nonspecific T wave abnormality Abnormal ECG No old tracing to compare Confirmed by Dione Booze ([id number]) on 01/07/2020 1:53:11 AM   Radiology DG Chest 2 View  Result Date: 01/06/2020 CLINICAL DATA:  29 year old female with shortness of breath. EXAM: CHEST - 2 VIEW COMPARISON:  Chest radiograph 12/09/2015. FINDINGS: Lung volumes and mediastinal contours are stable and within normal limits. Visualized tracheal air column is within normal limits. The lungs appear stable and clear. EKG button artifact in the right upper lung. No pneumothorax or pleural effusion. Mild but increased thoracolumbar scoliosis since 2017. No acute osseous abnormality identified. Negative visible bowel gas pattern. IMPRESSION: 1. No cardiopulmonary abnormality. 2. Mild but increasing thoracolumbar scoliosis. Electronically Signed   By: Odessa Fleming M.D.   On: 01/06/2020 23:01    Procedures Procedures (including critical care time)  Medications Ordered in ED Medications - No data to display  ED Course  I have reviewed the triage vital signs and the nursing notes.  Pertinent labs & imaging results that were available during my care of the patient were reviewed by me and considered in my medical decision making (see chart for details).  Patient seen and examined.  Reviewed labs with patient at bedside.  At this point she does not have any emergent findings to warrant additional emergency department evaluation.  However, she will certainly benefit from gastroenterology follow-up.  Will give referral.  We will make sure that patient can tolerate oral fluids prior to discharge here.  Vital signs reviewed and are as  follows: BP (!) 112/52   Pulse 71   Temp 98.2 F (36.8 C) (Oral)   Resp 18   Ht 5' (1.524 m)   Wt 45.4 kg   LMP 01/06/2020   SpO2 100%   BMI 19.53 kg/m   8:32 AM patient tolerating part of a sandwich and ginger ale in the room.  Gross exam unchanged.  Patient ready for discharge to home.  The patient was urged to return to the Emergency Department immediately with worsening of current symptoms, worsening abdominal pain, persistent vomiting, blood noted in stools, fever, or any other concerns. The patient verbalized understanding.      MDM Rules/Calculators/A&P                          Patient with nausea, vomiting, decreased appetite, intermittent abdominal pains. Vitals are stable, no fever. Labs overall reassuring.  She does have mild anemia which is likely chronic. Imaging not felt indicated at this time. No signs of dehydration, patient is tolerating PO's. Lungs are clear and no signs suggestive of PNA. Low concern for appendicitis, cholecystitis, pancreatitis, ruptured viscus, UTI, kidney stone, aortic dissection, aortic aneurysm or other emergent abdominal etiology.  Gastroenterology referral given.    Final Clinical Impression(s) / ED Diagnoses Final diagnoses:  Non-intractable vomiting with nausea, unspecified vomiting type  Decreased appetite    Rx / DC Orders ED Discharge Orders    None       Renne Crigler, PA-C 01/07/20 [id number]    Milagros Loll, MD 01/08/20 [phone]

## 2020-01-25 ENCOUNTER — Encounter: Payer: Self-pay | Admitting: Internal Medicine

## 2020-01-25 ENCOUNTER — Other Ambulatory Visit: Payer: Self-pay

## 2020-01-25 ENCOUNTER — Ambulatory Visit (INDEPENDENT_AMBULATORY_CARE_PROVIDER_SITE_OTHER): Payer: Medicaid Other | Admitting: Internal Medicine

## 2020-01-25 VITALS — BP 95/63 | HR 83 | Temp 98.3°F | Wt 100.0 lb

## 2020-01-25 DIAGNOSIS — Z227 Latent tuberculosis: Secondary | ICD-10-CM | POA: Diagnosis not present

## 2020-01-25 DIAGNOSIS — Z289 Immunization not carried out for unspecified reason: Secondary | ICD-10-CM

## 2020-01-25 NOTE — Assessment & Plan Note (Signed)
She is interested in the vaccine and this was discussed. She is going to get scheduled asap for the vaccine.

## 2020-01-25 NOTE — Assessment & Plan Note (Signed)
I again discussed the treatment, side effects, monitoring.  I discussed the benefits of treatment and odds of getting active Tb and what makes it more or less likely.  I do not feel treatment is urgent and I feel she should focus on getting the COVID vaccine asap and we can reassess starting latent Tb treatment in about 6 months.   She is in agreement with this plan rtc 6 months.

## 2020-01-25 NOTE — Progress Notes (Signed)
   Subjective:    Patient ID: Deanna Warren, female    DOB: August 14, 1990, 29 y.o.   MRN: [id number]  HPI Here for follow up of latent Tb I saw her 6 months ago and opted to start rifampin for latent Tb.  She though since that visited opted to wait to start.  She is worried about side effects.  She also is concerned with taking the medication and getting the COVID vaccine.  No cough, no fever, no concerns.    Review of Systems  Constitutional: Negative for chills, fatigue and fever.  Respiratory: Negative for cough and shortness of breath.   Gastrointestinal: Negative for diarrhea.  Skin: Negative for rash.       Objective:   Physical Exam Eyes:     General: No scleral icterus. Pulmonary:     Effort: Pulmonary effort is normal.  Skin:    Findings: No rash.  Neurological:     Mental Status: She is alert.           Assessment & Plan:

## 2020-07-27 ENCOUNTER — Ambulatory Visit: Payer: Medicaid Other | Admitting: Internal Medicine

## 2023-03-03 ENCOUNTER — Ambulatory Visit (INDEPENDENT_AMBULATORY_CARE_PROVIDER_SITE_OTHER): Payer: Medicaid Other

## 2023-03-03 ENCOUNTER — Encounter (HOSPITAL_COMMUNITY): Payer: Self-pay

## 2023-03-03 ENCOUNTER — Ambulatory Visit (HOSPITAL_COMMUNITY)
Admission: EM | Admit: 2023-03-03 | Discharge: 2023-03-03 | Disposition: A | Discharge status: Home / Self Care | Payer: Medicaid Other | Attending: Physician Assistant | Admitting: Physician Assistant

## 2023-03-03 DIAGNOSIS — R093 Abnormal sputum: Secondary | ICD-10-CM

## 2023-03-03 DIAGNOSIS — J069 Acute upper respiratory infection, unspecified: Secondary | ICD-10-CM

## 2023-03-03 DIAGNOSIS — R051 Acute cough: Secondary | ICD-10-CM | POA: Diagnosis present

## 2023-03-03 DIAGNOSIS — U071 COVID-19: Secondary | ICD-10-CM | POA: Diagnosis not present

## 2023-03-03 HISTORY — DX: Tuberculosis of lung: A15.0

## 2023-03-03 MED ORDER — FLUTICASONE PROPIONATE 50 MCG/ACT NA SUSP: 1.0000 | Freq: Every day | NASAL | 0 refills | Status: DC

## 2023-03-03 MED ORDER — PROMETHAZINE-DM 6.25-15 MG/5ML PO SYRP: 5.0000 mL | ORAL_SOLUTION | Freq: Four times a day (QID) | ORAL | 0 refills | Status: DC | PRN

## 2023-03-03 NOTE — Discharge Instructions (Signed)
Your x-ray was normal.  I am concerned that you might have a virus.  We are testing you for COVID and I will contact you if you are positive.  Please monitor your MyChart for these results.  Use Flonase for nasal congestion.  I recommend over-the-counter medications including Mucinex, Tylenol, nasal saline/sinus rinses.  Use Promethazine DM for cough.  This will make you sleepy so do not drive or drink alcohol while taking it.  Please follow-up with your primary care for further evaluation and management.  If you have any worsening or changing symptoms including high fever, worsening cough, chest pain, nausea/vomiting interfering with oral intake, weakness, blood in your sputum, dark discolored sputum please return for reevaluation.

## 2023-03-03 NOTE — ED Provider Notes (Signed)
[facility]    CSN: [id number] Arrival date & time: 03/03/23  1454      History   Chief Complaint Chief Complaint  Patient presents with   Nasal Congestion   Cough   Sore Throat    HPI Deanna Warren is a 32 y.o. female.   Patient presents today with 3-day history of URI symptoms.  She reports nasal congestion, sore throat, productive cough with thick dark sputum.  She denies any fever but has had some chills and feeling hot.  Denies any night sweats or unintentional weight loss; does report that she struggles with her weight related to depression she often will have a decreased appetite but has not noticed a significant fluctuation recently.  She has tried over-the-counter medications without improvement of symptoms.  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  She denies any recent antibiotics or steroids.  She has had COVID-19 vaccinations but is never had COVID.  She is confident that she is not pregnant.  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  She then had a another PPD placed in 2023 at [facility] that was read as negative.    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 Authorizing Provider  fluticasone  (FLONASE) 50 MCG/ACT nasal spray Place 1 spray into both nostrils daily. 03/03/23  Yes Melinda Pottinger K, PA-C  promethazine-dextromethorphan (PROMETHAZINE-DM) 6.25-15 MG/5ML syrup Take 5 mLs by mouth 4 (four) times daily as needed for cough. 03/03/23  Yes Clarisa Danser K, PA-C  dicyclomine (BENTYL) 10 MG capsule Take 1 capsule (10 mg total) by mouth 3 (three) times daily as needed for spasms. Patient not taking: Reported on 01/06/2020 10/11/18 01/07/20  Arby Barrette, MD    Family History Family History  Problem Relation Age of Onset   Hyperlipidemia Father     Social History Social History   Tobacco Use   Smoking status: Never   Smokeless tobacco: Never  Vaping Use   Vaping status: Never Used  Substance Use Topics   Alcohol use: Yes   Drug use: Not Currently    Frequency: 7.0 times per week    Types: Marijuana     Allergies   Patient has no known allergies.   Review of Systems Review of Systems  Constitutional:  Positive for activity change, chills and fatigue. Negative for appetite change, fever and unexpected weight change.  HENT:  Positive for congestion, postnasal drip and sore throat. Negative for sinus pressure and sneezing.   Respiratory:  Positive for cough. Negative for shortness of breath.   Cardiovascular:  Negative for chest pain.  Gastrointestinal:  Negative for abdominal pain, diarrhea, nausea and vomiting.     Physical Exam Triage Vital Signs ED Triage Vitals  Encounter Vitals Group     BP 03/03/23 1556 101/60     Systolic BP Percentile --      Diastolic BP Percentile --      Pulse Rate 03/03/23 1556 80     Resp 03/03/23 1556 14     Temp 03/03/23 1556 98.6 F (37 C)     Temp Source 03/03/23 1556 Oral     SpO2 03/03/23 1556 98 %     Weight --      Height --      Head Circumference --      Peak Flow --      Pain Score 03/03/23 1559 0     Pain Loc --      Pain Education --      Exclude from Growth Chart --    No data found.  Updated Vital  Signs BP 101/60 (BP Location: Left Arm)   Pulse 80   Temp 98.6 F (37 C) (Oral)   Resp 14   LMP 02/12/2023   SpO2 98%   Visual Acuity Right Eye Distance:   Left Eye Distance:   Bilateral Distance:    Right Eye Near:   Left Eye Near:    Bilateral Near:     Physical Exam Vitals reviewed.  Constitutional:      General: She is awake. She is not in acute distress.    Appearance: Normal appearance. She is well-developed. She is not ill-appearing.     Comments: Very pleasant female appears stated age in no acute distress sitting comfortably in exam room  HENT:     Head: Normocephalic and atraumatic.     Right Ear: Tympanic membrane, ear canal and external ear normal. Tympanic membrane is not erythematous or bulging.     Left Ear: Tympanic membrane, ear canal and external ear normal. Tympanic membrane is not erythematous or bulging.     Nose:     Right Sinus: Maxillary sinus tenderness present. No frontal sinus tenderness.     Left Sinus: Maxillary sinus tenderness present. No frontal sinus tenderness.     Mouth/Throat:     Pharynx: Uvula midline. Postnasal drip present. No oropharyngeal exudate or posterior oropharyngeal erythema.  Cardiovascular:     Rate and Rhythm: Normal rate and regular rhythm.     Heart sounds: Normal heart sounds, S1 normal and S2 normal. No murmur heard. Pulmonary:     Effort: Pulmonary effort is normal.     Breath sounds: Normal breath sounds. No wheezing, rhonchi or rales.     Comments: Clear to auscultation bilaterally Psychiatric:        Behavior: Behavior is cooperative.      UC Treatments / Results  Labs (all labs ordered are listed, but only abnormal results are displayed) Labs Reviewed  SARS CORONAVIRUS 2 (TAT 6-24 HRS)    EKG   Radiology DG Chest 2 View  Result Date: 03/03/2023 CLINICAL DATA:  worsening cough, history of positive PPD EXAM: CHEST - 2 VIEW COMPARISON:  December 29, 2019 FINDINGS: The cardiomediastinal silhouette is  normal in contour. No pleural effusion. No pneumothorax. No acute pleuroparenchymal abnormality. Visualized abdomen is unremarkable.  No acute osseous abnormality noted. IMPRESSION: No acute cardiopulmonary abnormality. Electronically Signed   By: Meda Klinefelter M.D.   On: 03/03/2023 16:43    Procedures Procedures (including critical care time)  Medications Ordered in UC Medications - No data to display  Initial Impression / Assessment and Plan / UC Course  I have reviewed the triage vital signs and the nursing notes.  Pertinent labs & imaging results that were available during my care of the patient were reviewed by me and considered in my medical decision making (see chart for details).     Patient is well-appearing, afebrile, nontoxic, nontachycardic.  Chest x-ray was obtained given her history of positive PPD with worsening cough that showed no acute cardiopulmonary disease.  No evidence of acute infection on physical exam that would warrant initiation of antibiotics.  We discussed likely viral etiology.  She was tested for COVID and COVID results are pending.  We will contact her if these are positive.  Would defer antiviral therapy given she is young and otherwise healthy.  She was treated symptomatically and given fluticasone for nasal congestion Promethazine DM for cough.  Discussed that Promethazine DM can be sedating so she is not to drive or drink alcohol while taking it.  She did report that her sputum has been dark gray intermittently.  More recently it has been yellow we discussed that typically we do not use antibiotics based on color of sputum but that if she continues to have abnormally colored sputum including black, dark gray, or she develops any blood she should return for reevaluation.  Recommend she follow-up with her primary care.  If she has any worsening symptoms she needs to be seen immediately.  Strict return precautions given.  Work excuse note provided.  Final  Clinical Impressions(s) / UC Diagnoses   Final diagnoses:  Upper respiratory tract infection, unspecified type  Acute cough  Abnormal color of sputum     Discharge Instructions      Your x-ray was normal.  I am concerned that you might have a virus.  We are testing you for COVID and I will contact you if you are positive.  Please monitor your MyChart for these results.  Use Flonase for nasal congestion.  I recommend over-the-counter medications including Mucinex, Tylenol, nasal saline/sinus rinses.  Use Promethazine DM for cough.  This will make you sleepy so do not drive or drink alcohol while taking it.  Please follow-up with your primary care for further evaluation and management.  If you have any worsening or changing symptoms including high fever, worsening cough, chest pain, nausea/vomiting interfering with oral intake, weakness, blood in your sputum, dark discolored sputum please return for reevaluation.     ED Prescriptions     Medication Sig Dispense Auth. Provider   fluticasone (FLONASE) 50 MCG/ACT nasal spray Place 1 spray into both nostrils daily. 16 g Kolbe Delmonaco K, PA-C   promethazine-dextromethorphan (PROMETHAZINE-DM) 6.25-15 MG/5ML syrup Take 5 mLs by mouth 4 (four) times daily as needed for cough. 118 mL Anderson Middlebrooks K, PA-C      PDMP not reviewed this encounter.   Jeani Hawking, PA-C 03/03/23 1705

## 2023-03-03 NOTE — ED Triage Notes (Signed)
Patient reports that she has had a sore throat x 3 days and a productive cough with black/gray sputum x 2 days.  Patient states she tested positive for TB in 2016 and never had any medication for the TB.

## 2023-03-04 LAB — SARS CORONAVIRUS 2 (TAT 6-24 HRS): SARS Coronavirus 2: POSITIVE — AB

## 2023-09-19 ENCOUNTER — Encounter (HOSPITAL_BASED_OUTPATIENT_CLINIC_OR_DEPARTMENT_OTHER): Payer: Self-pay | Admitting: *Deleted

## 2023-09-25 ENCOUNTER — Encounter (HOSPITAL_BASED_OUTPATIENT_CLINIC_OR_DEPARTMENT_OTHER): Payer: Self-pay

## 2023-09-25 ENCOUNTER — Ambulatory Visit (HOSPITAL_BASED_OUTPATIENT_CLINIC_OR_DEPARTMENT_OTHER)

## 2023-09-25 DIAGNOSIS — Z3A01 Less than 8 weeks gestation of pregnancy: Secondary | ICD-10-CM

## 2023-09-25 DIAGNOSIS — O3680X0 Pregnancy with inconclusive fetal viability, not applicable or unspecified: Secondary | ICD-10-CM

## 2023-09-25 MED ORDER — PROMETHAZINE HCL 25 MG PO TABS: 25.0000 mg | ORAL_TABLET | Freq: Four times a day (QID) | ORAL | 0 refills | Status: AC | PRN

## 2023-10-17 ENCOUNTER — Encounter (HOSPITAL_BASED_OUTPATIENT_CLINIC_OR_DEPARTMENT_OTHER): Payer: Self-pay

## 2023-10-17 ENCOUNTER — Ambulatory Visit (HOSPITAL_BASED_OUTPATIENT_CLINIC_OR_DEPARTMENT_OTHER): Admitting: *Deleted

## 2023-10-17 ENCOUNTER — Other Ambulatory Visit (HOSPITAL_COMMUNITY)
Admission: RE | Admit: 2023-10-17 | Discharge: 2023-10-17 | Disposition: A | Discharge status: Home / Self Care | Source: Ambulatory Visit | Attending: Obstetrics & Gynecology | Admitting: Obstetrics & Gynecology

## 2023-10-17 VITALS — BP 102/58 | HR 113 | Ht 60.0 in | Wt 106.4 lb

## 2023-10-17 DIAGNOSIS — Z3481 Encounter for supervision of other normal pregnancy, first trimester: Secondary | ICD-10-CM | POA: Diagnosis present

## 2023-10-17 DIAGNOSIS — Z3A10 10 weeks gestation of pregnancy: Secondary | ICD-10-CM

## 2023-10-17 DIAGNOSIS — Z3480 Encounter for supervision of other normal pregnancy, unspecified trimester: Secondary | ICD-10-CM | POA: Insufficient documentation

## 2023-10-17 MED ORDER — BLOOD PRESSURE KIT DEVI: 1.0000 | Freq: Once | 0 refills | Status: AC

## 2023-10-17 MED ORDER — GOJJI WEIGHT SCALE MISC: 1.0000 | Freq: Once | 0 refills | Status: AC

## 2023-10-17 NOTE — Progress Notes (Signed)
 New OB Intake  I explained I am completing New OB Intake today. We discussed EDD of 05/11/2024, by Last Menstrual Period. Pt is G2P1001. I reviewed her allergies, medications and Medical/Surgical/OB history.    Patient Active Problem List   Diagnosis Date Noted   Supervision of other normal pregnancy, antepartum 10/17/2023     Concerns addressed today  Delivery Plans Plans to deliver at [facility]. Discussed the nature of our practice with multiple providers including residents and students. Due to the size of the practice, the delivering provider may not be the same as those providing prenatal care.   Patient is not interested in water birth.  MyChart/Babyscripts MyChart access verified. I explained pt will have some visits in office and some virtually. Babyscripts instructions given and order placed.   Blood Pressure Cuff/Weight Scale Blood pressure cuff ordered for patient to pick-up from [employer]. Explained after first prenatal appt pt will check weekly and document in Babyscripts. Patient does not have weight scale; order sent to [facility], patient may track weight weekly in Babyscripts.  Anatomy US  Explained first scheduled US  will be around 19 weeks. Anatomy US  scheduled for 12/19/23 at 10:00.  Is patient a CenteringPregnancy candidate?  Declined Declined due to Group setting    Is patient a Mom+Baby Combined Care candidate?  Declined    Is patient a candidate for Babyscripts Optimization? Yes, patient declined   First visit review I reviewed new OB appt with patient. Explained pt will be seen by Hezzie Loupe, CNM at first visit. Discussed [facility] [place] genetic screening with patient. Pt would like both Pan[facility] and [facility] and would like gender reported. Routine prenatal labs collected   Last Pap 08/16/23 neg  Marliss Simple, RN 10/17/2023  12:03 PM

## 2023-10-18 LAB — HEMOGLOBIN A1C
Est. average glucose Bld gHb Est-mCnc: 117 mg/dL
Hgb A1c MFr Bld: 5.7 % — ABNORMAL HIGH (ref 4.8–5.6)

## 2023-10-18 LAB — CBC
Hematocrit: 31.8 % — ABNORMAL LOW (ref 34.0–46.6)
Hemoglobin: 10.6 g/dL — ABNORMAL LOW (ref 11.1–15.9)
MCH: 28.4 pg (ref 26.6–33.0)
MCHC: 33.3 g/dL (ref 31.5–35.7)
MCV: 85 fL (ref 79–97)
Platelets: 264 10*3/uL (ref 150–450)
RBC: 3.73 x10E6/uL — ABNORMAL LOW (ref 3.77–5.28)
RDW: 12.7 % (ref 11.7–15.4)
WBC: 7.6 10*3/uL (ref 3.4–10.8)

## 2023-10-18 LAB — RPR: RPR Ser Ql: NONREACTIVE

## 2023-10-18 LAB — URINE CULTURE

## 2023-10-18 LAB — HEPATITIS B SURFACE ANTIGEN: Hepatitis B Surface Ag: NEGATIVE

## 2023-10-18 LAB — ABO/RH: Rh Factor: POSITIVE

## 2023-10-18 LAB — CERVICOVAGINAL ANCILLARY ONLY
Chlamydia: NEGATIVE
Comment: NEGATIVE
Comment: NORMAL
Neisseria Gonorrhea: NEGATIVE

## 2023-10-18 LAB — HIV ANTIBODY (ROUTINE TESTING W REFLEX): HIV Screen 4th Generation wRfx: NONREACTIVE

## 2023-10-18 LAB — ANTIBODY SCREEN: Antibody Screen: NEGATIVE

## 2023-10-18 LAB — RUBELLA SCREEN: Rubella Antibodies, IGG: 1.33 {index} (ref >0.99)

## 2023-10-21 ENCOUNTER — Other Ambulatory Visit (HOSPITAL_BASED_OUTPATIENT_CLINIC_OR_DEPARTMENT_OTHER): Payer: Self-pay | Admitting: Certified Nurse Midwife

## 2023-10-21 ENCOUNTER — Encounter (HOSPITAL_BASED_OUTPATIENT_CLINIC_OR_DEPARTMENT_OTHER): Payer: Self-pay | Admitting: Certified Nurse Midwife

## 2023-10-21 DIAGNOSIS — Z3480 Encounter for supervision of other normal pregnancy, unspecified trimester: Secondary | ICD-10-CM

## 2023-10-21 DIAGNOSIS — O99011 Anemia complicating pregnancy, first trimester: Secondary | ICD-10-CM | POA: Insufficient documentation

## 2023-10-21 MED ORDER — ACCRUFER 30 MG PO CAPS: 1.0000 | ORAL_CAPSULE | ORAL | 12 refills | Status: AC

## 2023-10-24 LAB — PANORAMA PRENATAL TEST FULL PANEL:PANORAMA TEST PLUS 5 ADDITIONAL MICRODELETIONS: FETAL FRACTION: 10.1

## 2023-10-28 ENCOUNTER — Ambulatory Visit (HOSPITAL_BASED_OUTPATIENT_CLINIC_OR_DEPARTMENT_OTHER): Payer: Self-pay | Admitting: Certified Nurse Midwife

## 2023-10-29 ENCOUNTER — Encounter (HOSPITAL_BASED_OUTPATIENT_CLINIC_OR_DEPARTMENT_OTHER): Admitting: Certified Nurse Midwife

## 2023-10-29 LAB — HORIZON CUSTOM: REPORT SUMMARY: NEGATIVE

## 2023-10-30 ENCOUNTER — Ambulatory Visit (HOSPITAL_BASED_OUTPATIENT_CLINIC_OR_DEPARTMENT_OTHER): Admitting: Certified Nurse Midwife

## 2023-10-30 VITALS — BP 111/66 | HR 104 | Wt 106.2 lb

## 2023-10-30 DIAGNOSIS — Z3A12 12 weeks gestation of pregnancy: Secondary | ICD-10-CM

## 2023-10-30 DIAGNOSIS — R7309 Other abnormal glucose: Secondary | ICD-10-CM | POA: Diagnosis not present

## 2023-10-30 DIAGNOSIS — Z3480 Encounter for supervision of other normal pregnancy, unspecified trimester: Secondary | ICD-10-CM

## 2023-10-30 DIAGNOSIS — O99011 Anemia complicating pregnancy, first trimester: Secondary | ICD-10-CM | POA: Diagnosis not present

## 2023-10-30 NOTE — Progress Notes (Signed)
 INITIAL PRENATAL VISIT  Subjective:   Deanna Warren is being seen today for her first obstetrical visit.  This is not a planned pregnancy. This is a desired pregnancy.  She and FOB are not in a relationship. There is no hx of abuse. Pt feels that he will be supportive and will co-parent. Pt states she may have some depression. She is aware of availability  medication and/or counseling if desired.  She is at [redacted]w[redacted]d gestation by LMP.  Her obstetrical history is significant for Term SVD. Relationship with FOB: ex-boyfriend. Patient does intend to breast feed. Pregnancy history fully reviewed.  Patient reports fatigue.  Indications for ASA therapy (per uptodate) One of the following:  Two or more of the following: Nulliparity No Obesity (body mass index >30 kg/m2) No Family history of preeclampsia in mother or sister No Age >=35 years No Sociodemographic characteristics (African American race, low socioeconomic level) Yes Personal risk factors (eg, previous pregnancy with low birth weight or small for gestational age infant, previous adverse pregnancy outcome [eg, stillbirth], interval >10 years between pregnancies) Yes   Review of Systems:   Review of Systems  Objective:    Obstetric History OB History  Gravida Para Term Preterm AB Living  2 1 1   1   SAB IAB Ectopic Multiple Live Births     0 1    # Outcome Date GA Lbr Len/2nd Weight Sex Type Anes PTL Lv  2 Current           1 Term 02/24/15 [redacted]w[redacted]d 35:18 / 00:44 6 lb 8.8 oz (2.97 kg) F Vag-Spont EPI  LIV     Birth Comments: Hgb, Normal, FA Newborn Screen Barcode: [id number] Date Collected: 02/24/2015    Past Medical History:  Diagnosis Date   Depression    Medical history non-contributory    TB (pulmonary tuberculosis)    TB lung, 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     Ferric Maltol  (ACCRUFER ) 30 MG CAPS Take 1 capsule (30 mg total) by mouth every other day. (Patient not taking: Reported on 10/30/2023) 15 capsule 12   promethazine  (PHENERGAN ) 25 MG tablet Take 1 tablet (25 mg total) by mouth every 6 (six) hours as needed. (Patient not taking: Reported on 10/30/2023) 30 tablet 0   [DISCONTINUED] dicyclomine  (BENTYL ) 10 MG capsule Take 1 capsule (10 mg total) by mouth 3 (three) times daily as needed for spasms. (Patient not taking: Reported on 01/06/2020) 30 capsule 0   No current facility-administered medications on file prior to visit.    No Known Allergies  Social History:  reports that she has never smoked. She has never used smokeless tobacco. She reports that she does not currently use alcohol. She reports that she does not currently use drugs after having used the following drugs: Marijuana. Frequency: 7.00 times per week.  Family History  Problem Relation Age of Onset   Hyperlipidemia Father     The following portions of the patient's history were reviewed and updated as appropriate: allergies, current medications, past family history, past medical history, past social history, past surgical history and problem list.  Review of Systems Review of Systems  Constitutional: Negative.   HENT: Negative.    Eyes: Negative.  Respiratory: Negative.    Cardiovascular: Negative.   Gastrointestinal: Negative.   Endocrine: Negative.   Genitourinary: Negative.   Musculoskeletal: Negative.   Skin: Negative.   Allergic/Immunologic: Negative.   Neurological: Negative.   Hematological: Negative.   Psychiatric/Behavioral:  Positive for dysphoric mood. Negative for agitation, behavioral problems, decreased concentration, hallucinations, self-injury, sleep disturbance and suicidal ideas. The patient is not nervous/anxious and is not hyperactive.   All other systems  reviewed and are negative.    Physical Exam:  BP 111/66   Pulse (!) 104   Wt 106 lb 3.2 oz (48.2 kg)   LMP 08/05/2023   BMI 20.74 kg/m  CONSTITUTIONAL: Well-developed, well-nourished female in no acute distress.  HENT:  Normocephalic, atraumatic.  Oropharynx is clear and moist SKIN: Skin is warm and dry. No rash noted. Not diaphoretic. No erythema. No pallor. MUSCULOSKELETAL: Normal range of motion. No tenderness.  No cyanosis, clubbing, or edema.   NEUROLOGIC: Alert and oriented to person, place, and time. Normal muscle tone coordination.  PSYCHIATRIC: Normal mood and affect. Normal behavior. Normal judgment and thought content. CARDIOVASCULAR: Normal heart rate noted, regular rhythm RESPIRATORY: Effort and breath sounds normal, no problems with respiration noted.      Movement: Absent       Assessment:    Pregnancy: G2P1001   1. Elevated hemoglobin A1c (Primary) - Pt encouraged to schedule a 2hr GTT - Glucose Tolerance, 2 Hours w/1 Hour  2. Supervision of other normal pregnancy, antepartum - Pt encouraged to take ASA 81mg  po once daily - Continue prenatal vitamin  3. Anemia affecting pregnancy in first trimester - Pt states she cannot tolerate oral Iron - Iron rich foods encouraged  4. [redacted] weeks gestation of pregnancy   Plan:    Initial labs reviewed Prenatal vitamins. Problem list reviewed and updated. Reviewed in detail the nature of the practice with collaborative care between  Genetic screening discussed: NIPS/First trimester screen/Quad/AFP results reviewed. Role of ultrasound in pregnancy discussed; Anatomy US : requested. Amniocentesis discussed: not indicated. Follow up in 4 weeks. Weight gain recommendations per IOM guidelines reviewed: underweight/BMI 18.5 or less > 28 - 40 lbs; normal weight/BMI 18.5 - 24.9 > 25 - 35 lbs; overweight/BMI 25 - 29.9 > 15 - 25 lbs; obese/BMI  30 or more > 11 - 20 lbs.  Discussed clinic routines, schedule of care and testing,  genetic screening options, involvement of students and residents under the direct supervision of APPs and doctors and presence of female providers. Pt verbalized understanding.   Yolanda Hence, CNM 10/30/2023 11:58 AM

## 2023-11-07 ENCOUNTER — Telehealth: Payer: Self-pay | Admitting: Clinical

## 2023-11-07 NOTE — Telephone Encounter (Signed)
Attempt call regarding referral; Left HIPPA-compliant message to call back Mikle Sternberg from [facility] at  [phone] (Ashelyn Mccravy's office).    

## 2023-11-27 ENCOUNTER — Encounter (HOSPITAL_BASED_OUTPATIENT_CLINIC_OR_DEPARTMENT_OTHER): Admitting: Obstetrics & Gynecology

## 2023-11-28 ENCOUNTER — Encounter (HOSPITAL_BASED_OUTPATIENT_CLINIC_OR_DEPARTMENT_OTHER): Admitting: Obstetrics and Gynecology

## 2023-11-28 ENCOUNTER — Telehealth (HOSPITAL_BASED_OUTPATIENT_CLINIC_OR_DEPARTMENT_OTHER): Payer: Self-pay | Admitting: Obstetrics and Gynecology

## 2023-11-28 NOTE — Telephone Encounter (Signed)
 Called patient and left a message to call the office back to reschedule the  missed appointment .

## 2023-12-06 DIAGNOSIS — R7309 Other abnormal glucose: Secondary | ICD-10-CM | POA: Insufficient documentation

## 2023-12-19 ENCOUNTER — Ambulatory Visit

## 2023-12-19 ENCOUNTER — Other Ambulatory Visit

## 2023-12-19 DIAGNOSIS — R7309 Other abnormal glucose: Secondary | ICD-10-CM

## 2023-12-24 ENCOUNTER — Encounter (HOSPITAL_BASED_OUTPATIENT_CLINIC_OR_DEPARTMENT_OTHER): Admitting: Certified Nurse Midwife

## 2024-01-21 ENCOUNTER — Encounter (HOSPITAL_BASED_OUTPATIENT_CLINIC_OR_DEPARTMENT_OTHER): Admitting: Certified Nurse Midwife

## 2024-01-21 ENCOUNTER — Encounter (HOSPITAL_BASED_OUTPATIENT_CLINIC_OR_DEPARTMENT_OTHER): Payer: Self-pay | Admitting: Certified Nurse Midwife

## 2024-02-11 ENCOUNTER — Encounter (HOSPITAL_BASED_OUTPATIENT_CLINIC_OR_DEPARTMENT_OTHER): Admitting: Certified Nurse Midwife

## 2024-03-03 ENCOUNTER — Encounter (HOSPITAL_BASED_OUTPATIENT_CLINIC_OR_DEPARTMENT_OTHER): Admitting: Obstetrics & Gynecology

## 2024-03-17 ENCOUNTER — Encounter (HOSPITAL_BASED_OUTPATIENT_CLINIC_OR_DEPARTMENT_OTHER): Admitting: Certified Nurse Midwife

## 2024-03-31 ENCOUNTER — Encounter (HOSPITAL_BASED_OUTPATIENT_CLINIC_OR_DEPARTMENT_OTHER): Admitting: Obstetrics & Gynecology

## 2024-04-14 ENCOUNTER — Encounter (HOSPITAL_BASED_OUTPATIENT_CLINIC_OR_DEPARTMENT_OTHER): Admitting: Certified Nurse Midwife

## 2024-04-21 ENCOUNTER — Encounter (HOSPITAL_BASED_OUTPATIENT_CLINIC_OR_DEPARTMENT_OTHER): Admitting: Certified Nurse Midwife

## 2024-04-28 ENCOUNTER — Encounter (HOSPITAL_BASED_OUTPATIENT_CLINIC_OR_DEPARTMENT_OTHER): Admitting: Obstetrics & Gynecology

## 2024-05-05 ENCOUNTER — Encounter (HOSPITAL_BASED_OUTPATIENT_CLINIC_OR_DEPARTMENT_OTHER): Admitting: Certified Nurse Midwife

## 2024-05-12 ENCOUNTER — Encounter (HOSPITAL_BASED_OUTPATIENT_CLINIC_OR_DEPARTMENT_OTHER): Admitting: Certified Nurse Midwife
